# Patient Record
Sex: Male | Born: 2000 | Race: Black or African American | Hispanic: No | Marital: Single | Smoking: Current every day smoker
Health system: Southern US, Community
[De-identification: ages and names within clinical notes are randomized; demographics above are authoritative.]

## PROBLEM LIST (undated history)

## (undated) ENCOUNTER — Emergency Department (HOSPITAL_COMMUNITY): Payer: Medicaid Other

## (undated) DIAGNOSIS — F329 Major depressive disorder, single episode, unspecified: Secondary | ICD-10-CM

## (undated) DIAGNOSIS — J45909 Unspecified asthma, uncomplicated: Secondary | ICD-10-CM

## (undated) DIAGNOSIS — F32A Depression, unspecified: Secondary | ICD-10-CM

## (undated) DIAGNOSIS — L309 Dermatitis, unspecified: Secondary | ICD-10-CM

## (undated) DIAGNOSIS — F419 Anxiety disorder, unspecified: Secondary | ICD-10-CM

## (undated) DIAGNOSIS — F29 Unspecified psychosis not due to a substance or known physiological condition: Secondary | ICD-10-CM

## (undated) DIAGNOSIS — F1994 Other psychoactive substance use, unspecified with psychoactive substance-induced mood disorder: Secondary | ICD-10-CM

---
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
## 2018-03-26 IMAGING — CT CT HEAD W/O CM
3 series · 15 of 47 positions shown, 18 images · non-contrast
Comparison: June 06, 2015

CLINICAL DATA: Progressive headache and confusion

EXAM:
CT HEAD WITHOUT CONTRAST
TECHNIQUE: Contiguous axial images were obtained from the base of the skull
through the vertex without intravenous contrast.

[Series 3: head 5.0 h30s · axial · 0.49mm/px · z∈[-94,+36]mm · 9 of 32 slices shown, 12 images]
[im 3/32  brain]
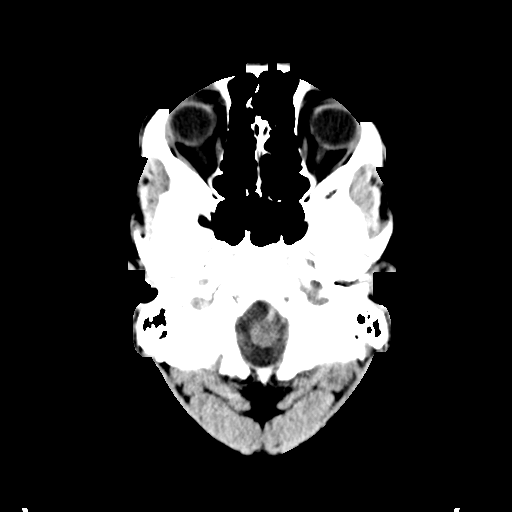
[im 3/32  bone]
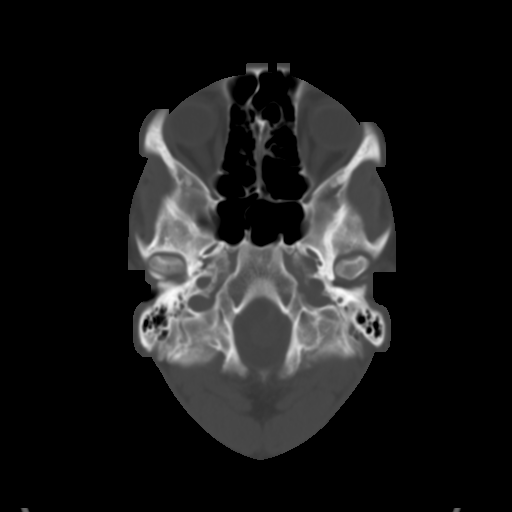
[im 6/32  brain]
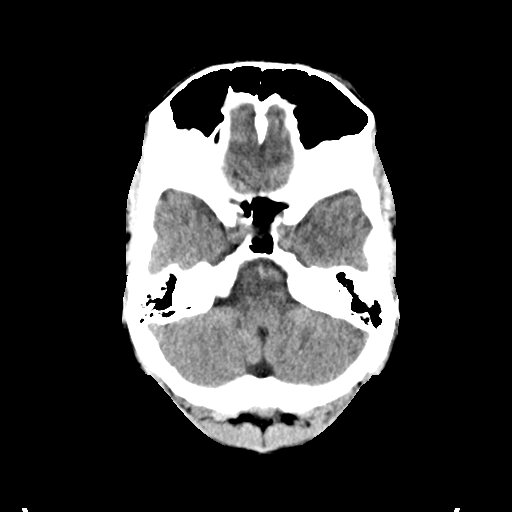
[im 9/32  brain]
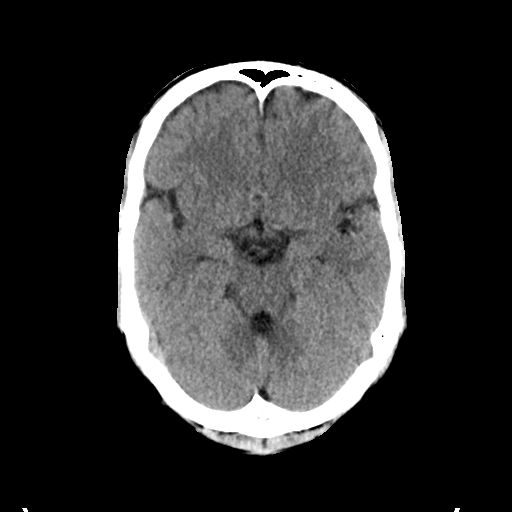
[im 12/32  brain]
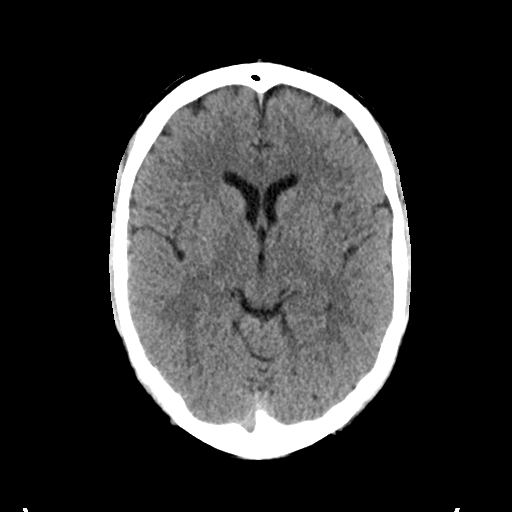
[im 17/32  brain]
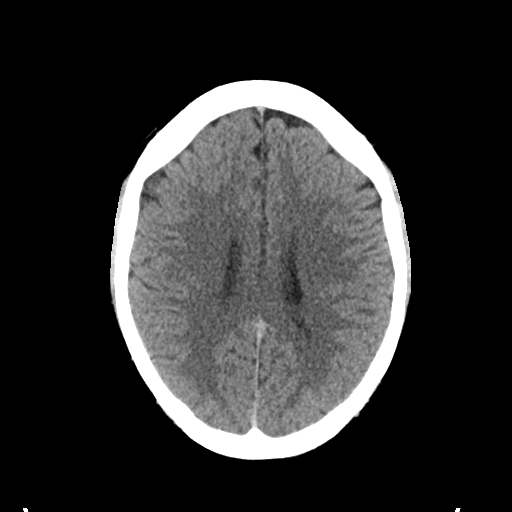
[im 17/32  bone]
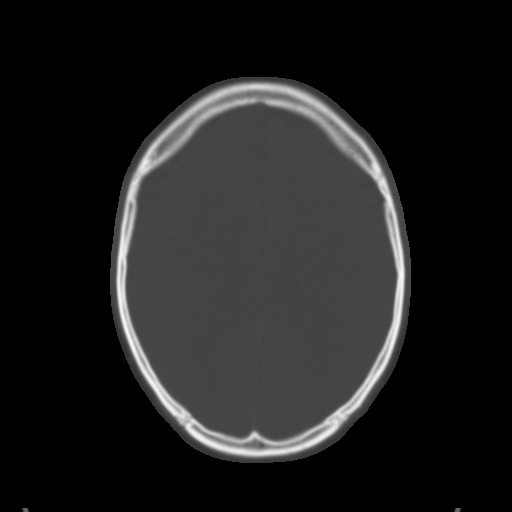
[im 20/32  brain]
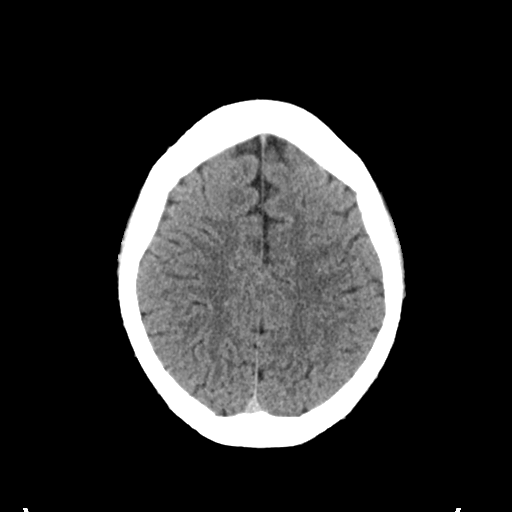
[im 23/32  brain]
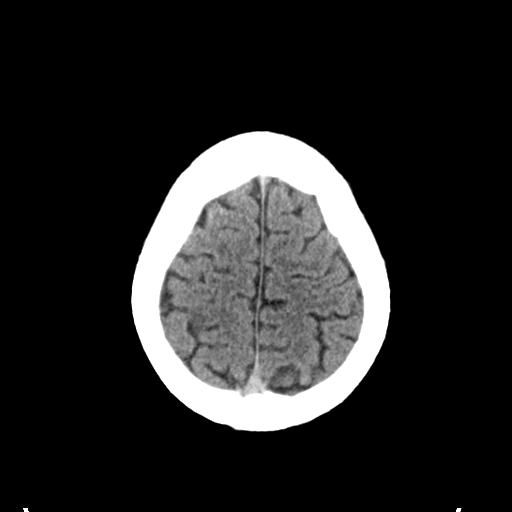
[im 26/32  brain]
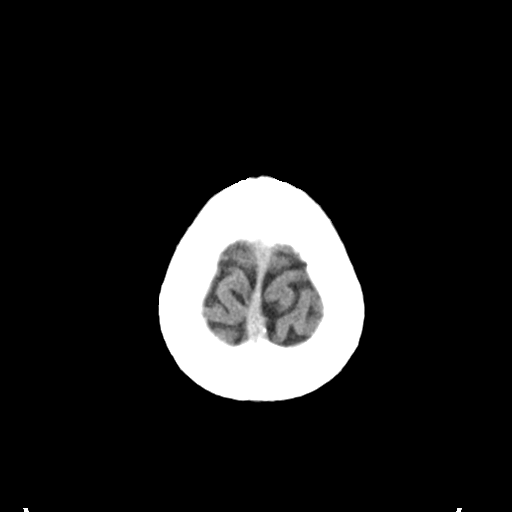
[im 29/32  brain]
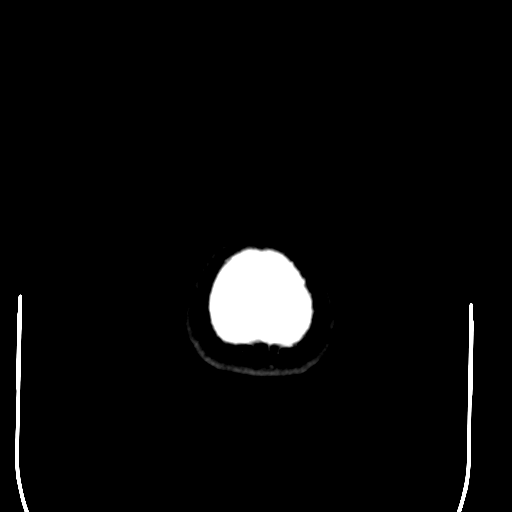
[im 29/32  bone]
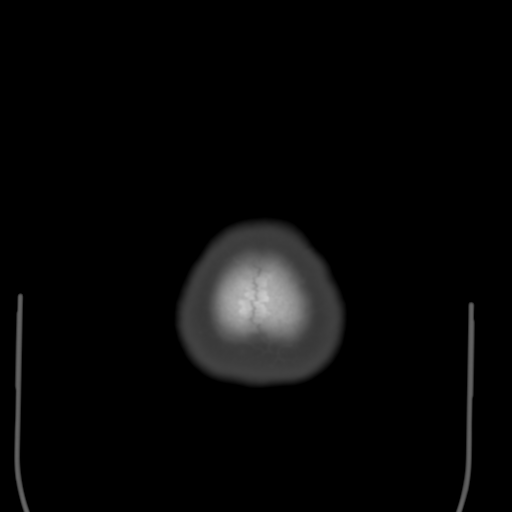

[Series 5: head 3.0 mpr cor · coronal · 0.33mm/px · 3 of 67 slices shown]
[im 23/67  brain]
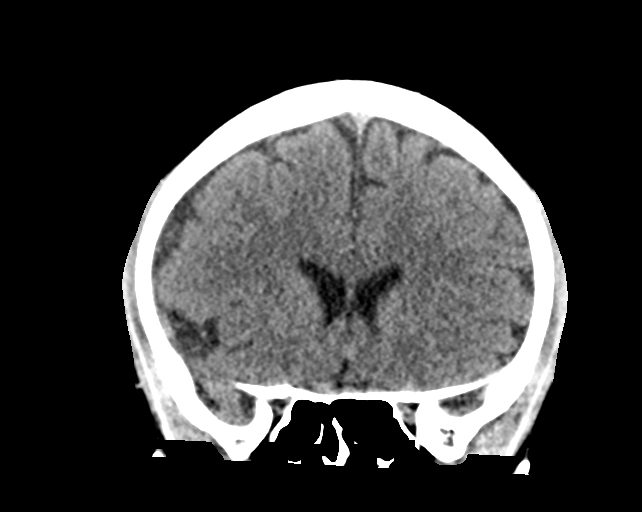
[im 30/67  brain]
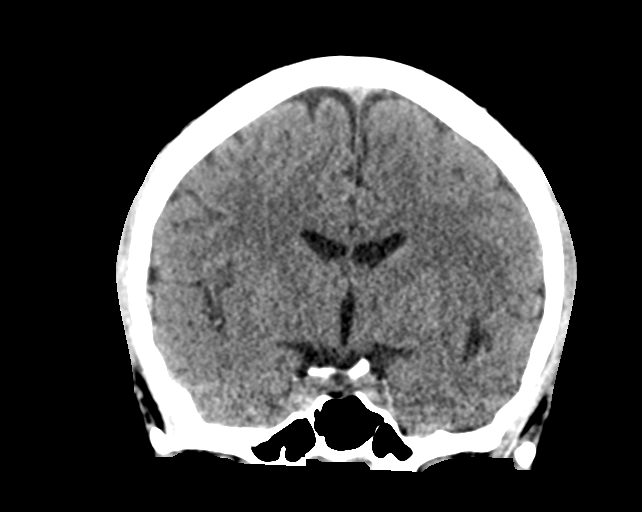
[im 37/67  brain]
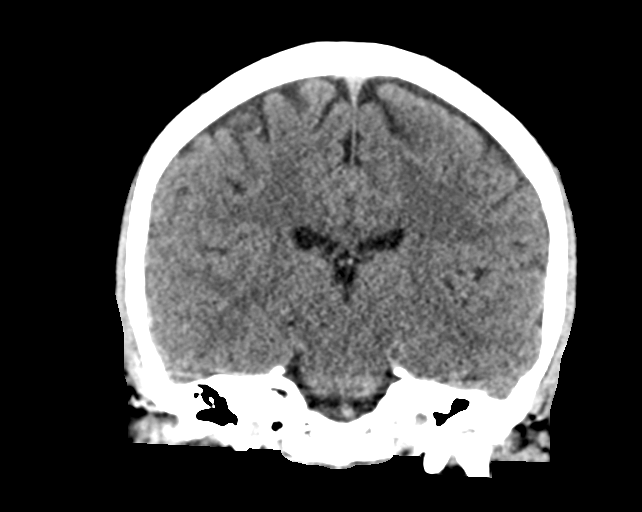

[Series 6: head 3.0 mpr sag · sagittal · 0.34mm/px · 3 of 61 slices shown]
[im 21/61  brain]
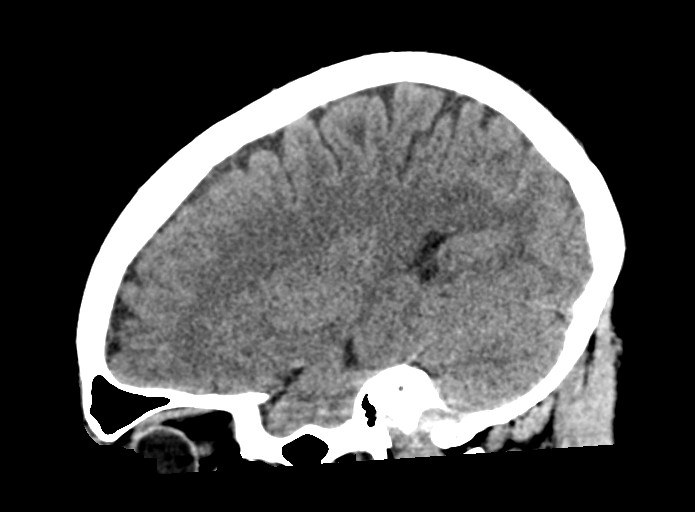
[im 31/61  brain]
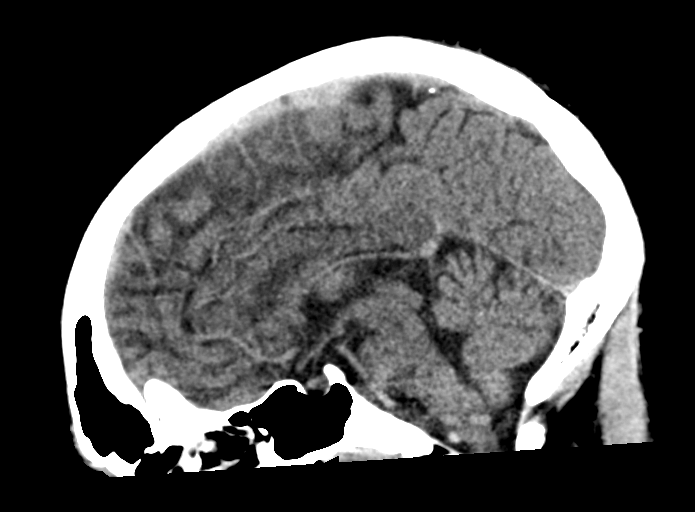
[im 41/61  brain]
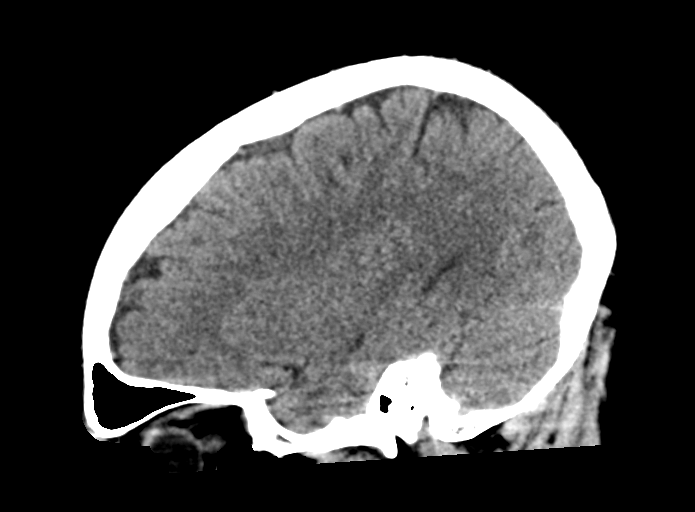

[15 of 47 positions shown; findings below may reference images not displayed]

FINDINGS: Brain: The ventricles are normal in size and configuration. There is
no intracranial mass, hemorrhage, extra-axial fluid collection, or
midline shift. Gray-white compartments appear normal. No evident
acute infarct.

Vascular: No hyperdense vessel. No vascular calcifications are
evident.

Skull: Bony calvarium appears intact.

There is mucosal thickening and mild opacification in several
ethmoid air cell regions bilaterally. Other visualized paranasal
sinuses are clear. Visualized orbits appear symmetric bilaterally.
There are several ethmoid air cells demonstrated

Other: Mastoid air cells clear.
IMPRESSION: Areas of ethmoid sinus disease bilaterally. No intracranial mass,
hemorrhage, or extra-axial fluid collection. Gray-white compartments
are normal.

## 2019-11-19 ENCOUNTER — Other Ambulatory Visit: Payer: Self-pay

## 2019-11-19 ENCOUNTER — Emergency Department
Admission: EM | Admit: 2019-11-19 | Discharge: 2019-11-19 | Disposition: A | Discharge status: Home / Self Care | Payer: Medicaid Other | Attending: Emergency Medicine | Admitting: Emergency Medicine

## 2019-11-19 ENCOUNTER — Emergency Department: Payer: Medicaid Other

## 2019-11-19 DIAGNOSIS — Z7951 Long term (current) use of inhaled steroids: Secondary | ICD-10-CM | POA: Insufficient documentation

## 2019-11-19 DIAGNOSIS — R0602 Shortness of breath: Secondary | ICD-10-CM | POA: Diagnosis present

## 2019-11-19 DIAGNOSIS — F419 Anxiety disorder, unspecified: Secondary | ICD-10-CM | POA: Insufficient documentation

## 2019-11-19 DIAGNOSIS — F29 Unspecified psychosis not due to a substance or known physiological condition: Secondary | ICD-10-CM | POA: Diagnosis present

## 2019-11-19 DIAGNOSIS — J45909 Unspecified asthma, uncomplicated: Secondary | ICD-10-CM | POA: Insufficient documentation

## 2019-11-19 DIAGNOSIS — F1994 Other psychoactive substance use, unspecified with psychoactive substance-induced mood disorder: Secondary | ICD-10-CM

## 2019-11-19 DIAGNOSIS — F329 Major depressive disorder, single episode, unspecified: Secondary | ICD-10-CM | POA: Diagnosis present

## 2019-11-19 DIAGNOSIS — F3132 Bipolar disorder, current episode depressed, moderate: Secondary | ICD-10-CM | POA: Diagnosis present

## 2019-11-19 DIAGNOSIS — F323 Major depressive disorder, single episode, severe with psychotic features: Secondary | ICD-10-CM | POA: Diagnosis present

## 2019-11-19 MED ORDER — IPRATROPIUM-ALBUTEROL 0.5-2.5 (3) MG/3ML IN SOLN
3.0000 mL | Freq: Once | RESPIRATORY_TRACT | Status: AC
  Administered 2019-11-19: 3 mL via RESPIRATORY_TRACT
  Filled 2019-11-19: qty 3

## 2019-11-19 MED ORDER — ALBUTEROL SULFATE HFA 108 (90 BASE) MCG/ACT IN AERS: 2.0000 | INHALATION_SPRAY | RESPIRATORY_TRACT | 0 refills | Status: DC | PRN

## 2019-11-19 NOTE — ED Provider Notes (Signed)
[facility] Emergency Department Provider Note   ____________________________________________   First MD Initiated Contact with Patient 11/19/19 0111     (approximate)  I have reviewed the triage vital signs and the nursing notes.   HISTORY  Chief Complaint Shortness of Breath    HPI Mario Lloyd is a 19 y.o. male who presents to the ED from home with a chief complaint of shortness of breath.  Patient with a history of asthma and states he has been short of breath for the past 3 years.  States symptoms no worse today. Denies fever, chills, cough, chest pain, abdominal pain, nausea, vomiting or diarrhea.  Denies recent travel, trauma or hormone use.  Denies COVID-19 exposure.       Past Medical History:  Diagnosis Date  . Anxiety   . Asthma   . Depression   . Eczema   . Eczema   . Psychosis 06/07/2015  . Substance induced mood disorder (HCC) 06/14/2015    Patient Active Problem List   Diagnosis Date Noted  . MDD (major depressive disorder) 08/14/2016  . Bipolar disorder with moderate depression (HCC) 08/14/2016  . Seizure-like activity (HCC) 08/10/2016  . Substance induced mood disorder (HCC) 06/14/2015  . Severe major depression with psychotic features (HCC)   . Psychosis (HCC) 06/07/2015    No past surgical history on file.  Prior to Admission medications   Medication Sig Start Date End Date Taking? Authorizing Provider  albuterol (VENTOLIN HFA) 108 (90 Base) MCG/ACT inhaler Inhale 2 puffs into the lungs every 4 (four) hours as needed for wheezing or shortness of breath. 11/19/19   Irean Hong, MD  citalopram (CELEXA) 10 MG tablet Take 10 mg by mouth daily.    [provider]  citalopram (CELEXA) 10 MG tablet Take 1 tablet (10 mg total) by mouth daily. 08/24/16   Thedora Hinders, MD  OLANZapine (ZYPREXA) 10 MG tablet Take 1 tablet (10 mg total) by mouth at bedtime. 08/23/16   Thedora Hinders, MD     Allergies Patient has no known allergies.  No family history on file.  Social History Social History   Tobacco Use  . Smoking status: Never Smoker  . Smokeless tobacco: Never Used  Substance Use Topics  . Alcohol use: No  . Drug use: Yes    Types: Marijuana    Comment: UDS was negative; Pt + Benzos    Review of Systems  Constitutional: No fever/chills Eyes: No visual changes. ENT: No sore throat. Cardiovascular: Denies chest pain. Respiratory: Positive for shortness of breath. Gastrointestinal: No abdominal pain.  No nausea, no vomiting.  No diarrhea.  No constipation. Genitourinary: Negative for dysuria. Musculoskeletal: Negative for back pain. Skin: Negative for rash. Neurological: Negative for headaches, focal weakness or numbness.   ____________________________________________   PHYSICAL EXAM:  VITAL SIGNS: ED Triage Vitals [11/19/19 0037]  Enc Vitals Group     BP (!) 135/95     Pulse Rate 70     Resp 20     Temp 98.7 F (37.1 C)     Temp Source Oral     SpO2 99 %     Weight 150 lb (68 kg)     Height 5\' 6"  (1.676 m)     Head Circumference      Peak Flow      Pain Score      Pain Loc      Pain Edu?      Excl. in GC?  Constitutional: Alert and oriented. Well appearing and in no acute distress. Eyes: Conjunctivae are normal. PERRL. EOMI. Head: Atraumatic. Nose: No congestion/rhinnorhea. Mouth/Throat: Mucous membranes are moist.  Oropharynx non-erythematous. Neck: No stridor.   Cardiovascular: Normal rate, regular rhythm. Grossly normal heart sounds.  Good peripheral circulation. Respiratory: Normal respiratory effort.  No retractions. Lungs slightly diminished bibasilarly. Gastrointestinal: Soft and nontender. No distention. No abdominal bruits. No CVA tenderness. Musculoskeletal: No lower extremity tenderness nor edema.  No joint effusions. Neurologic:  Normal speech and language. No gross focal neurologic deficits are appreciated. No gait  instability. Skin:  Skin is warm, dry and intact. No rash noted. Psychiatric: Mood and affect are normal. Speech and behavior are normal.  ____________________________________________   LABS (all labs ordered are listed, but only abnormal results are displayed)  Labs Reviewed  SARS CORONAVIRUS 2 (TAT 6-24 HRS)   ____________________________________________  EKG  None ____________________________________________  RADIOLOGY  ED MD interpretation: No acute cardiopulmonary process  Official radiology report(s): DG Chest 2 View  Result Date: 11/19/2019 CLINICAL DATA:  Shortness of breath, history of childhood asthma, current smoker EXAM: CHEST - 2 VIEW COMPARISON:  Radiograph 04/18/2004 FINDINGS: No consolidation, features of edema, pneumothorax, or effusion. Pulmonary vascularity is normally distributed. The cardiomediastinal contours are unremarkable. No acute osseous or soft tissue abnormality. IMPRESSION: No acute cardiopulmonary abnormality. Electronically Signed   By: Kreg Shropshire M.D.   On: 11/19/2019 01:37    ____________________________________________   PROCEDURES  Procedure(s) performed (including Critical Care):  Procedures   ____________________________________________   INITIAL IMPRESSION / ASSESSMENT AND PLAN / ED COURSE  As part of my medical decision making, I reviewed the following data within the electronic MEDICAL RECORD NUMBER Nursing notes reviewed and incorporated, Old chart reviewed, Radiograph reviewed and Notes from prior ED visits     Mario Lloyd was evaluated in Emergency Department on 11/19/2019 for the symptoms described in the history of present illness. He was evaluated in the context of the global COVID-19 pandemic, which necessitated consideration that the patient might be at risk for infection with the SARS-CoV-2 virus that causes COVID-19. Institutional protocols and algorithms that pertain to the evaluation of patients at risk for COVID-19  are in a state of rapid change based on information released by regulatory bodies including the CDC and federal and state organizations. These policies and algorithms were followed during the patient's care in the ED.    19 year old male presenting with shortness of breath x3 years; history of asthma. Differential includes, but is not limited to, viral syndrome, bronchitis including COPD exacerbation, pneumonia, reactive airway disease including asthma, CHF including exacerbation with or without pulmonary/interstitial edema, pneumothorax, ACS, thoracic trauma, and pulmonary embolism.  Will administer DuoNeb, obtain chest x-ray and reassess.  Clinical Course as of Nov 19 415  Fri Nov 19, 2019  0148 Updated patient on chest x-ray.  He is currently finishing his DuoNeb.  Has mentioned several times in some multiple staff members that he is anxious and has "a lot on his plate".  Has an extensive psychiatric history.  Denies active SI/HI/AH/VH, but states he would like to speak with behavioral health.   [JS]  0221 Patient was seen by psychiatric NP and TTS.  Patient has established outpatient follow-up, denied SI/HI to them.  Agree he may be discharged home with follow-up.  Feeling significantly better after breathing treatment.  Will prescribe albuterol inhaler and patient will follow up with his PCP.  Strict return precautions given.  Patient verbalizes understanding agrees  with plan of care.   [JS]  [id number] Patient requesting COVID-19 swab; he is getting vaccination next week.   [JS]    Clinical Course User Index [JS] Irean Hong, MD     ____________________________________________   FINAL CLINICAL IMPRESSION(S) / ED DIAGNOSES  Final diagnoses:  Shortness of breath  Anxiety     ED Discharge Orders         Ordered    albuterol (VENTOLIN HFA) 108 (90 Base) MCG/ACT inhaler  Every 4 hours PRN     Discontinue  Reprint     11/19/19 0247           Note:  This document was prepared  using Dragon voice recognition software and may include unintentional dictation errors.   Irean Hong, MD 11/19/19 [phone]

## 2019-11-19 NOTE — ED Notes (Signed)
Pt reports intermittent SOB for years. Reports childhood asthma. Reports during flare ups, feels mild tightness in chest and short of breath. Pt reports current smoker, reports sometimes SOB is triggered by anxiety. Does not notice any triggers by exercise, weather, foods, or otherwise.

## 2019-11-19 NOTE — ED Notes (Signed)
Pt reports he moved recently and has "had a lot going on right now." Pt reports he has been feeling more anxious than normal since the move. When asked directly, pt stated that he "wouldn't mind talking to someone while he's here"  MD notified, consults ordered

## 2019-11-19 NOTE — Discharge Instructions (Signed)
1.  Use Albuterol inhaler 2 puffs every 4 hours as needed for difficulty breathing. 2.  Return to the ER for worsening symptoms, persistent vomiting, difficulty breathing or other concerns.

## 2019-11-19 NOTE — ED Notes (Signed)
Pt left without signing or covid swab

## 2019-11-19 NOTE — ED Notes (Signed)
Pt denies command hallucinations. Pt denies SI/HI. Pt reports he takes his psychiatric medications inconsistently, advised to follow his prescriptions and follow up with his psychiatrist as soon as possible

## 2019-11-19 NOTE — Consult Note (Signed)
[facility] Face-to-Face Psychiatry Consult   Reason for Consult: SOB Referring Physician: Dr. Dolores Frame Patient Identification: Mario Lloyd MRN:  [id number] Principal Diagnosis: <principal problem not specified> Diagnosis:  Active Problems:   Psychosis (HCC)   Severe major depression with psychotic features (HCC)   Substance induced mood disorder (HCC)   MDD (major depressive disorder)   Bipolar disorder with moderate depression (HCC)   Total Time spent with patient: 30 minutes  Subjective: "No, I do not want hurt myself or anybody.  I do bother from anxiety and depression." Mario KLIEBERT is a 19 y.o. male patient presented to [facility] ED for POV voluntarily. Per the ED triage nurse note, the patient came in with complaints of shortness of breath for years.  He voiced symptoms not indifferent today than previous. The patient admits to being seen on an outpatient basis at neuropsychiatry in [place].  He is currently prescribed citalopram for anxiety and depression.  He revealed the last hospitalization were during his late teen years which he was hospitalized at behavioral health in [place] about three years ago.  The patient voiced he is doing okay.  He stated, "my anxiety and depression started when I was in high school and not doing well in school.  I got kicked off the basketball team, which made my depression and anxiety worse.  I had to be hospitalized.  I was placed on citalopram which has helped me a lot." The patient was seen face-to-face by this provider; the chart was reviewed and consulted with Dr.Sung on 11/19/2019 due to the patient's care. It was discussed with the EDP that the patient does not meet the criteria to be admitted to the psychiatric inpatient unit.  The patient is alert and oriented x 4, calm, cooperative, and mood-congruent with affect on evaluation. The patient does not appear to be responding to internal or external stimuli. Neither is the patient presenting with any  delusional thinking. The patient denies auditory or visual hallucinations. The patient denies any suicidal, homicidal, or self-harm ideations. The patient is not presenting with any psychotic or paranoid behaviors. During an encounter with the patient, he was able to answer questions appropriately.    Plan: The patient is not a safety risk to self or others and does not require psychiatric inpatient admission for stabilization and treatment.  HPI:   Past Psychiatric History:   Risk to Self:   NO Risk to Others:   No Prior Inpatient Therapy:   Yes Prior Outpatient Therapy:   Yes  Past Medical History:  Past Medical History:  Diagnosis Date  . Anxiety   . Asthma   . Depression   . Eczema   . Eczema   . Psychosis 06/07/2015  . Substance induced mood disorder (HCC) 06/14/2015   No past surgical history on file. Family History: No family history on file. Family Psychiatric  History: No pertinent family psychiatric history Social History:  Social History   Substance and Sexual Activity  Alcohol Use No     Social History   Substance and Sexual Activity  Drug Use Yes  . Types: Marijuana   Comment: UDS was negative; Pt + Benzos    Social History   Socioeconomic History  . Marital status: Single    Spouse name: Not on file  . Number of children: Not on file  . Years of education: Not on file  . Highest education level: Not on file  Occupational History  . Not on file  Tobacco Use  .  Smoking status: Never Smoker  . Smokeless tobacco: Never Used  Substance and Sexual Activity  . Alcohol use: No  . Drug use: Yes    Types: Marijuana    Comment: UDS was negative; Pt + Benzos  . Sexual activity: Never  Other Topics Concern  . Not on file  Social History Narrative  . Not on file   Social Determinants of Health   Financial Resource Strain:   . Difficulty of Paying Living Expenses:   Food Insecurity:   . Worried About Programme researcher, broadcasting/film/video in the Last Year:   . Engineer, site in the Last Year:   Transportation Needs:   . Freight forwarder (Medical):   Marland Kitchen Lack of Transportation (Non-Medical):   Physical Activity:   . Days of Exercise per Week:   . Minutes of Exercise per Session:   Stress:   . Feeling of Stress :   Social Connections:   . Frequency of Communication with Friends and Family:   . Frequency of Social Gatherings with Friends and Family:   . Attends Religious Services:   . Active Member of Clubs or Organizations:   . Attends Banker Meetings:   Marland Kitchen Marital Status:    Additional Social History:    Allergies:  No Known Allergies  Labs: No results found for this or any previous visit (from the past 48 hour(s)).  No current facility-administered medications for this encounter.   Current Outpatient Medications  Medication Sig Dispense Refill  . citalopram (CELEXA) 10 MG tablet Take 10 mg by mouth daily.    . citalopram (CELEXA) 10 MG tablet Take 1 tablet (10 mg total) by mouth daily. 30 tablet 0  . OLANZapine (ZYPREXA) 10 MG tablet Take 1 tablet (10 mg total) by mouth at bedtime. 30 tablet 0    Musculoskeletal: Strength & Muscle Tone: within normal limits Gait & Station: normal Patient leans: N/A  Psychiatric Specialty Exam: Physical Exam Psychiatric:        Attention and Perception: Attention normal.        Mood and Affect: Mood normal.        Speech: Speech normal.        Behavior: Behavior normal.        Thought Content: Thought content normal.        Cognition and Memory: Cognition normal.        Judgment: Judgment normal.     Review of Systems  Psychiatric/Behavioral: The patient is nervous/anxious.     Blood pressure (!) 135/95, pulse 70, temperature 98.7 F (37.1 C), temperature source Oral, resp. rate 20, height 5\' 6"  (1.676 m), weight 68 kg, SpO2 99 %.Body mass index is 24.21 kg/m.  General Appearance: Casual  Eye Contact:  Good  Speech:  Clear and Coherent  Volume:  Normal  Mood:  Anxious and  Depressed  Affect:  Appropriate and Congruent  Thought Process:  Coherent  Orientation:  Full (Time, Place, and Person)  Thought Content:  WDL and Logical  Suicidal Thoughts:  No  Homicidal Thoughts:  No  Memory:  Immediate;   Good Recent;   Good Remote;   Good  Judgement:  Good  Insight:  Good  Psychomotor Activity:  Normal  Concentration:  Concentration: Good and Attention Span: Good  Recall:  Good  Fund of Knowledge:  Good  Language:  Good  Akathisia:  Negative  Handed:  Right  AIMS (if indicated):     Assets:  Communication Skills  Desire for Improvement Physical Health Resilience Social Support  ADL's:  Intact  Cognition:  WNL  Sleep:        Treatment Plan Summary: Daily contact with patient to assess and evaluate symptoms and progress in treatment, Plan Patient does not meet criteria for psychiatric inpatient admission. and .  Disposition: No evidence of imminent risk to self or others at present.   Patient does not meet criteria for psychiatric inpatient admission. Supportive therapy provided about ongoing stressors. Refer to IOP. Discussed crisis plan, support from social network, calling 911, coming to the Emergency Department, and calling Suicide Hotline.  Gillermo Murdoch, NP 11/19/2019 2:42 AM

## 2019-11-19 NOTE — ED Triage Notes (Signed)
Pt in with co shob for years, states symptoms not any different today. NO shob noted at this time.

## 2020-06-10 ENCOUNTER — Emergency Department (HOSPITAL_COMMUNITY)
Admission: EM | Admit: 2020-06-10 | Discharge: 2020-06-11 | Disposition: A | Discharge status: Home / Self Care | Payer: Medicaid Other | Attending: Emergency Medicine | Admitting: Emergency Medicine

## 2020-06-10 ENCOUNTER — Encounter (HOSPITAL_COMMUNITY): Payer: Self-pay | Admitting: Emergency Medicine

## 2020-06-10 ENCOUNTER — Other Ambulatory Visit: Payer: Self-pay

## 2020-06-10 DIAGNOSIS — F3481 Disruptive mood dysregulation disorder: Secondary | ICD-10-CM | POA: Insufficient documentation

## 2020-06-10 DIAGNOSIS — J45909 Unspecified asthma, uncomplicated: Secondary | ICD-10-CM | POA: Diagnosis not present

## 2020-06-10 DIAGNOSIS — F69 Unspecified disorder of adult personality and behavior: Secondary | ICD-10-CM | POA: Diagnosis not present

## 2020-06-10 DIAGNOSIS — Z046 Encounter for general psychiatric examination, requested by authority: Secondary | ICD-10-CM | POA: Diagnosis present

## 2020-06-10 LAB — COMPREHENSIVE METABOLIC PANEL
ALT: 20 U/L (ref 0–44)
AST: 20 U/L (ref 15–41)
Albumin: 4.3 g/dL (ref 3.5–5.0)
Alkaline Phosphatase: 56 U/L (ref 38–126)
Anion gap: 8 (ref 5–15)
BUN: 15 mg/dL (ref 6–20)
CO2: 27 mmol/L (ref 22–32)
Calcium: 9.6 mg/dL (ref 8.9–10.3)
Chloride: 104 mmol/L (ref 98–111)
Creatinine, Ser: 1.26 mg/dL — ABNORMAL HIGH (ref 0.61–1.24)
GFR, Estimated: >60 mL/min (ref >60)
Glucose, Bld: 92 mg/dL (ref 70–99)
Potassium: 3.8 mmol/L (ref 3.5–5.1)
Sodium: 139 mmol/L (ref 135–145)
Total Bilirubin: 0.5 mg/dL (ref 0.3–1.2)
Total Protein: 7.2 g/dL (ref 6.5–8.1)

## 2020-06-10 LAB — CBC
HCT: 46.8 % (ref 39.0–52.0)
Hemoglobin: 14.8 g/dL (ref 13.0–17.0)
MCH: 27.9 pg (ref 26.0–34.0)
MCHC: 31.6 g/dL (ref 30.0–36.0)
MCV: 88.1 fL (ref 80.0–100.0)
Platelets: 305 10*3/uL (ref 150–400)
RBC: 5.31 MIL/uL (ref 4.22–5.81)
RDW: 13 % (ref 11.5–15.5)
WBC: 9.9 10*3/uL (ref 4.0–10.5)
nRBC: 0 % (ref 0.0–0.2)

## 2020-06-10 LAB — RAPID URINE DRUG SCREEN, HOSP PERFORMED
Amphetamines: NOT DETECTED
Barbiturates: NOT DETECTED
Benzodiazepines: NOT DETECTED
Cocaine: NOT DETECTED
Opiates: NOT DETECTED
Tetrahydrocannabinol: POSITIVE — AB

## 2020-06-10 LAB — ETHANOL: Alcohol, Ethyl (B): <10 mg/dL (ref <10)

## 2020-06-10 NOTE — ED Triage Notes (Signed)
Patient's father reports behavior changes on patient this week , blank stares , insomnia , arms twitchings , anxiety and restlessness , he suspects side effects of his medications , denies hallucinations or suicidal ideation .

## 2020-06-11 NOTE — BH Assessment (Signed)
Comprehensive Clinical Assessment (CCA) Note  06/11/2020 Mario Lloyd [id number]  Chief Complaint:  F34.8 Disruptive mood dysregulation disorder Chief Complaint  Patient presents with  . Psychiatric Evaluation   Visit Diagnosis:  F34.8 Disruptive mood dysregulation disorder  Mario Lloyd is a 20 years old male patient who [facility] accompanied by his father, Mario Lloyd [phone], who participated in assessment at  [name] request.  Pt reports that he worry's and suffer from depression. Pt reports "I become anxious and irritable when I am out of money, I want to make enough of money so I can move from my father's house".  Pt father's reports his son's behavorial has changed, "he is talking to himself; also looks off in to space".  Pt father reports that the changes occurred after his last injection". Pt reports no suicide ideation or suicide attempts.  Pt denies any recent manic symptoms.  Pt denies any history of intentional self-injurious behaviors.  Pt denies homicidal ideation or history of violence.  Pt reports "I use to hear voices when I was a junior in high school, the voices have stop, after I started my medication".  Pt says he smoke marijuana every other day and denies any other other substance use.   Pt identified his primary stressor as worrying about finding a new location to live and spending excess money on eating out.  Pt reports that he work part time for [employer].  Pt live with his father, step mother and brother.  Pt denies any history of mental illness and substance use in his family.  Pt denies any history of abuse or trauma.  Pt denies any current legal problems.    Pt say he is currently receiving monthly injection from [facility], " my medication might need to be adjusted, I can tell".  Pt is dressed in casual attire, alert, oriented x 4 with normal speech and restless motor behavioral.  Eye contact is good and Pt mood  euthymic.  [name] affect is anxious.  Thought process coherent and relevant.  [name] insight is good and judgement is impaired.  There is no indication Pt is currently responding to interneural stimuli or experiencing delusional thought content.  Pt was cooperative throughout assessment.            Disposition: [place] Abtsody Taylor PA-C, recommends pt to be discharged and follow-up with a forthcoming appointment at [facility] or Behavioral Health Outpatient. Disposition discussed with Magazine features editorChristine RN, via secure chat in Epic. RN to discuss disposition with EDP.  CCA Screening, Triage and Referral (STR)  Patient Reported Information How did you hear about us? Family/Friend  Referral name: No data recorded Referral phone number: [phone]   Whom do you see for routine medical problems? No data recorded Practice/Facility Name: No data recorded Practice/Facility Phone Number: No data recorded Name of Contact: No data recorded Contact Number: No data recorded Contact Fax Number: No data recorded Prescriber Name: No data recorded Prescriber Address (if known): No data recorded  What Is the Reason for Your Visit/Call Today? medication adjustment  How Long Has This Been Causing You Problems? 1 wk - 1 month  What Do You Feel Would Help You the Most Today? -- (Pt reports that his father feels that his medication needs to be adjusted.)   Have You Recently Been in Any Inpatient Treatment (Hospital/Detox/Crisis Center/28-Day Program)? No  Name/Location of Program/Hospital:No data recorded How Long Were You There? No data recorded When Were You  Discharged? No data recorded  Have You Ever Received Services From [facility] Before? Yes  Who Do You See at [facility]? 11/19/2019   Have You Recently Had Any Thoughts About Hurting Yourself? No  Are You Planning to Commit Suicide/Harm Yourself At This time? No   Have you Recently Had Thoughts About Hurting Someone [name]?  No  Explanation: No data recorded  Have You Used Any Alcohol or Drugs in the Past 24 Hours? No  How Long Ago Did You Use Drugs or Alcohol? No data recorded What Did You Use and How Much? No data recorded  Do You Currently Have a Therapist/Psychiatrist? Yes  Name of Therapist/Psychiatrist: Pt reports that he see Mario Lloyd at [facility]   Have You Been Recently Discharged From Any Office Practice or Programs? No  Explanation of Discharge From Practice/Program: No data recorded    CCA Screening Triage Referral Assessment Type of Contact: Tele-Assessment  Is this Initial or Reassessment? Initial Assessment  Date Telepsych consult ordered in [facility]:  06/11/2020  Time Telepsych consult ordered in [facility]:  No data recorded  Patient Reported Information Reviewed? Yes  Patient Left Without Being Seen? No data recorded Reason for Not Completing Assessment: No data recorded  Collateral Involvement: Pt provided his father information - [place] Hel[place] [phone]   Does Patient Have a Court Appointed Legal Guardian? No data recorded Name and Contact of Legal Guardian: No data recorded If Minor and Not Living with Parent(s), Who has Custody? n/a  Is CPS involved or ever been involved? Never  Is APS involved or ever been involved? -- (UTA)   Patient Determined To Be At Risk for Harm To Self or Others Based on Review of Patient Reported Information or Presenting Complaint? No  Method: No data recorded Availability of Means: No data recorded Intent: No data recorded Notification Required: No data recorded Additional Information for Danger to Others Potential: No data recorded Additional Comments for Danger to Others Potential: No data recorded Are There Guns or Other Weapons in Your Home? No data recorded Types of Guns/Weapons: No data recorded Are These Weapons Safely Secured?                            No data recorded Who Could Verify You Are Able To Have  These Secured: No data recorded Do You Have any Outstanding Charges, Pending Court Dates, Parole/Probation? No data recorded Contacted To Inform of Risk of Harm To Self or Others: No data recorded  Location of Assessment: [facility]   Does Patient Present under Involuntary Commitment? No  IVC Papers Initial File Date: No data recorded  [place] of Residence: [place]   Patient Currently Receiving the Following Services: Individual Therapy   Determination of Need: Urgent (48 hours)   Options For Referral: Outpatient Therapy     CCA Biopsychosocial Intake/Chief Complaint:  Anxiety, medication adjustment  Current Symptoms/Problems: worrying, restlessness   Patient Reported Schizophrenia/Schizoaffective Diagnosis in Past: No   Strengths: UTA  Preferences: UTA  Abilities: UTA   Type of Services Patient Feels are Needed: Pt reports that he see Education officer, community at [facility] and he open for therapy   Initial Clinical Notes/Concerns: worrying   Mental Health Symptoms Depression:  No data recorded  Duration of Depressive symptoms: No data recorded  Mania:  No data recorded  Anxiety:   Restlessness; Irritability; Worrying   Psychosis:  None   Duration of Psychotic symptoms: No  data recorded  Trauma:  None   Obsessions:  None   Compulsions:  None   Inattention:  Disorganized   Hyperactivity/Impulsivity:  N/A   Oppositional/Defiant Behaviors:  None   Emotional Irregularity:  Unstable self-image   Other Mood/Personality Symptoms:  No data recorded   Mental Status Exam Appearance and self-care  Stature:  Average   Weight:  Average weight   Clothing:  Casual   Grooming:  Normal   Cosmetic use:  Age appropriate   Posture/gait:  Normal   Motor activity:  Restless   Sensorium  Attention:  Normal   Concentration:  Normal   Orientation:  Object; Person; Place; Situation   Recall/memory:  Normal   Affect and Mood   Affect:  Anxious   Mood:  Euthymic   Relating  Eye contact:  Normal   Facial expression:  Responsive   Attitude toward examiner:  Cooperative   Thought and Language  Speech flow: Clear and Coherent   Thought content:  Appropriate to Mood and Circumstances   Preoccupation:  -- (UTA)   Hallucinations:  Other (Comment) (Pt father states he talks to himself)   Organization:  No data recorded  [employer] of Knowledge:  Average   Intelligence:  -- Industrial/product designer)   Abstraction:  -- (UTA)   Judgement:  No data recorded  Reality Testing:  Realistic   Insight:  Good   Decision Making:  Normal   Social Functioning  Social Maturity:  Impulsive   Social Judgement:  -- Industrial/product designer)   Stress  Stressors:  Family conflict (Pt reports that he wants to make enough of money so he can leave his father's house.)   Coping Ability:  Normal   Skill Deficits:  Museum/gallery curator; Decision making   Supports:  Friends/Service system     Religion: Religion/Spirituality Are You A Religious Person?:  (UTA) How Might This Affect Treatment?: UTA  Leisure/Recreation: Leisure / Recreation Do You Have Hobbies?:  (UTA)  Exercise/Diet: Exercise/Diet Do You Exercise?:  (UTA) Have You Gained or Lost A Significant Amount of Weight in the Past Six Months?: No Do You Follow a Special Diet?:  (UTA) Do You Have Any Trouble Sleeping?: No (Pt reports he sleeps eight hours during the night)   CCA Employment/Education Employment/Work Situation: Employment / Work Situation Employment situation: Employed Where is patient currently employed?: [employer] How long has patient been employed?: [employer] Patient's job has been impacted by current illness: No What is the longest time patient has a held a job?: UTA Where was the patient employed at that time?: UTA Has patient ever been in the [employer]?: No  Education: Education Is Patient Currently Attending School?: No Last Grade  Completed: 12 Name of High School: [employer] Did [place] Graduate From [employer]?: Yes Did Theme park manager?: No Did Designer, television/film set?: No Did You Have Any Special Interests In School?: UTA Did You Have An Individualized Education Program (IIEP):  (UTA) Did You Have Any Difficulty At School?:  (UTA) Patient's Education Has Been Impacted by Current Illness:  (UTA)   CCA Family/Childhood History Family and Relationship History: Family history Are you sexually active?:  (UTA) What is your sexual orientation?: UTA Has your sexual activity been affected by drugs, alcohol, medication, or emotional stress?: UTA Does patient have children?: No  Childhood History:  Childhood History By whom was/is the patient raised?: Father (Pt lives with his father, stepmom and brother) Additional childhood history information: UTA Description of patient's relationship  with caregiver when they were a child: UTA Patient's description of current relationship with people who raised him/her: UTA How were you disciplined when you got in trouble as a child/adolescent?: UTA Does patient have siblings?: Yes Number of Siblings: 1 Description of patient's current relationship with siblings: UTA Did patient suffer any verbal/emotional/physical/sexual abuse as a child?: No Did patient suffer from severe childhood neglect?: No Has patient ever been sexually abused/assaulted/raped as an adolescent or adult?: No Was the patient ever a victim of a crime or a disaster?: No Witnessed domestic violence?: No Has patient been affected by domestic violence as an adult?: No  Child/Adolescent Assessment:     CCA Substance Use Alcohol/Drug Use: Alcohol / Drug Use Pain Medications: See MRA Prescriptions: See MRA Over the Counter: See MRA History of alcohol / drug use?: Yes Negative Consequences of Use: Personal relationships Withdrawal Symptoms: Agitation Substance #1 Name of Substance 1:  Marijuana 1 - Age of First Use: UTA 1 - Amount (size/oz): 1 Blunt 1 - Frequency: Every other day 1 - Duration: ongoing 1 - Last Use / Amount: UTA                       ASAM's:  Six Dimensions of Multidimensional Assessment  Dimension 1:  Acute Intoxication and/or Withdrawal Potential:      Dimension 2:  Biomedical Conditions and Complications:      Dimension 3:  Emotional, Behavioral, or Cognitive Conditions and Complications:     Dimension 4:  Readiness to Change:     Dimension 5:  Relapse, Continued use, or Continued Problem Potential:     Dimension 6:  Recovery/Living Environment:     ASAM Severity Score:    ASAM Recommended Level of Treatment:     Substance use Disorder (SUD)    Recommendations for Services/Supports/Treatments:    DSM5 Diagnoses: Patient Active Problem List   Diagnosis Date Noted  . MDD (major depressive disorder) 08/14/2016  . Bipolar disorder with moderate depression (HCC) 08/14/2016  . Seizure-like activity (HCC) 08/10/2016  . Substance induced mood disorder (HCC) 06/14/2015  . Severe major depression with psychotic features (HCC)   . Psychosis (HCC) 06/07/2015       Referrals to Alternative Service(s): Referred to Alternative Service(s):   Place:   Date:   Time:    Referred to Alternative Service(s):   Place:   Date:   Time:    Referred to Alternative Service(s):   Place:   Date:   Time:    Referred to Alternative Service(s):   Place:   Date:   Time:     Meryle Ready, Counselor

## 2020-06-11 NOTE — ED Notes (Signed)
Breakfast order placed ?

## 2020-06-11 NOTE — ED Provider Notes (Signed)
[facility] EMERGENCY DEPARTMENT Provider Note   CSN: [id number] Arrival date & time: 06/10/20  1934     History Chief Complaint  Patient presents with  . Psychiatric Evaluation    Mario Lloyd is a 20 y.o. male.  The history is provided by the patient and medical records.    20 y.o. M here for psychiatric evaluation.  Father expressed concerns about his behaviors to triage RN.  Patient denies this, states "my dad overreacts".  He admit he has not been sleeping but it is because "I am worried about making money".  He states he does eat well.  He states he does not understand why he has to be here because nothing is wrong.  States he is taking his medications as directed.  12:27 AM Called and spoke with patient's parents via phone-- states he has been getting monthly Abilify injections for the past 2 years.  He switched to injections because he was noncompliant with oral medications.  Father states over the past 2 weeks he has had spacing out episodes, not taking care of his hygiene, decreased sleep (4 hours a night or so).  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  They have noticed him talking to himself and he did admit that he was hearing voices a few days ago.  Did report to mother that he is only been taking his Celexa every other day.  Past Medical History:  Diagnosis Date  . Anxiety   . Asthma   . Depression   . Eczema   . Eczema   . Psychosis (HCC) 06/07/2015  . Substance induced mood disorder (HCC) 06/14/2015    Patient Active Problem List   Diagnosis Date Noted  . MDD (major depressive disorder) 08/14/2016  . Bipolar disorder with moderate depression (HCC) 08/14/2016  . Seizure-like activity (HCC) 08/10/2016  . Substance induced mood disorder (HCC) 06/14/2015  . Severe major depression with psychotic features (HCC)   . Psychosis (HCC) 06/07/2015    History reviewed. No pertinent surgical  history.     No family history on file.  Social History   Tobacco Use  . Smoking status: Never Smoker  . Smokeless tobacco: Never Used  Substance Use Topics  . Alcohol use: No  . Drug use: Yes    Types: Marijuana    Comment: UDS was negative; Pt + Benzos    Home Medications Prior to Admission medications   Medication Sig Start Date End Date Taking? Authorizing Provider  albuterol (VENTOLIN HFA) 108 (90 Base) MCG/ACT inhaler Inhale 2 puffs into the lungs every 4 (four) hours as needed for wheezing or shortness of breath. 11/19/19   Irean Hong, MD  citalopram (CELEXA) 10 MG tablet Take 10 mg by mouth daily.    [provider]  citalopram (CELEXA) 10 MG tablet Take 1 tablet (10 mg total) by mouth daily. 08/24/16   Thedora Hinders, MD  OLANZapine (ZYPREXA) 10 MG tablet Take 1 tablet (10 mg total) by mouth at bedtime. 08/23/16   Thedora Hinders, MD    Allergies    Patient has no known allergies.  Review of Systems   Review of Systems  Psychiatric/Behavioral: Positive for behavioral problems.  All other systems reviewed and are negative.   Physical Exam Updated Vital Signs BP 129/68   Pulse 68   Temp 98.3 F (36.8 C) (Oral)   Resp 17   Ht 5\' 7"  (1.702 m)  Wt 75 kg   SpO2 98%   BMI 25.90 kg/m   Physical Exam Vitals and nursing note reviewed.  Constitutional:      Appearance: He is well-developed and well-nourished.  HENT:     Head: Normocephalic and atraumatic.     Mouth/Throat:     Mouth: Oropharynx is clear and moist.  Eyes:     Extraocular Movements: EOM normal.     Conjunctiva/sclera: Conjunctivae normal.     Pupils: Pupils are equal, round, and reactive to light.  Cardiovascular:     Rate and Rhythm: Normal rate and regular rhythm.     Heart sounds: Normal heart sounds.  Pulmonary:     Effort: Pulmonary effort is normal.     Breath sounds: Normal breath sounds.  Abdominal:     General: Bowel sounds are normal.      Palpations: Abdomen is soft.  Musculoskeletal:        General: Normal range of motion.     Cervical back: Normal range of motion.  Skin:    General: Skin is warm and dry.  Neurological:     Mental Status: He is alert and oriented to person, place, and time.  Psychiatric:        Mood and Affect: Mood and affect normal.     Comments: Odd affect, spacing out a bit during exam (?internal stimuli), denies SI/HI/AVH to me     ED Results / Procedures / Treatments   Labs (all labs ordered are listed, but only abnormal results are displayed) Labs Reviewed  COMPREHENSIVE METABOLIC PANEL - Abnormal; Notable for the following components:      Result Value   Creatinine, Ser 1.26 (*)    All other components within normal limits  RAPID URINE DRUG SCREEN, HOSP PERFORMED - Abnormal; Notable for the following components:   Tetrahydrocannabinol POSITIVE (*)    All other components within normal limits  ETHANOL  CBC    EKG None  Radiology No results found.  Procedures Procedures (including critical care time)  Medications Ordered in ED Medications - No data to display  ED Course  I have reviewed the triage vital signs and the nursing notes.  Pertinent labs & imaging results that were available during my care of the patient were reviewed by me and considered in my medical decision making (see chart for details).    MDM Rules/Calculators/A&P  20 year old male presenting to the ED for change in behavior.  Patient is not very forthcoming with details, he is agreeable to let me speak with father about this.  Over the past 2 weeks he is been neglecting his personal hygiene, not sleeping, has been hearing voices, and talking to himself.  He did tell his mother that he had only been taking his Celexa every other day.  He does receive IM Abilify injections monthly.  Last dose was 2 weeks ago.  Labs are reassuring.  COVID screen pending.  Will get TTS consult.  Final Clinical Impression(s) / ED  Diagnoses Final diagnoses:  Adult behavior problem    Rx / DC Orders ED Discharge Orders    None       Garlon Hatchet, PA-C 06/11/20 4008    Marily Memos, MD 06/11/20 [phone]

## 2021-01-03 ENCOUNTER — Emergency Department (HOSPITAL_COMMUNITY)
Admission: EM | Admit: 2021-01-03 | Discharge: 2021-01-03 | Disposition: A | Discharge status: Home / Self Care | Payer: Medicaid Other | Attending: Emergency Medicine | Admitting: Emergency Medicine

## 2021-01-03 ENCOUNTER — Encounter (HOSPITAL_COMMUNITY): Payer: Self-pay | Admitting: Emergency Medicine

## 2021-01-03 ENCOUNTER — Emergency Department (EMERGENCY_DEPARTMENT_HOSPITAL)
Admission: EM | Admit: 2021-01-03 | Discharge: 2021-01-04 | Disposition: A | Discharge status: Home / Self Care | Payer: Medicaid Other | Source: Home / Self Care | Attending: Emergency Medicine | Admitting: Emergency Medicine

## 2021-01-03 DIAGNOSIS — F1290 Cannabis use, unspecified, uncomplicated: Secondary | ICD-10-CM | POA: Insufficient documentation

## 2021-01-03 DIAGNOSIS — S01511A Laceration without foreign body of lip, initial encounter: Secondary | ICD-10-CM | POA: Insufficient documentation

## 2021-01-03 DIAGNOSIS — F69 Unspecified disorder of adult personality and behavior: Secondary | ICD-10-CM | POA: Insufficient documentation

## 2021-01-03 DIAGNOSIS — Y900 Blood alcohol level of less than 20 mg/100 ml: Secondary | ICD-10-CM | POA: Insufficient documentation

## 2021-01-03 DIAGNOSIS — Z79899 Other long term (current) drug therapy: Secondary | ICD-10-CM | POA: Insufficient documentation

## 2021-01-03 DIAGNOSIS — F329 Major depressive disorder, single episode, unspecified: Secondary | ICD-10-CM | POA: Insufficient documentation

## 2021-01-03 DIAGNOSIS — S60222A Contusion of left hand, initial encounter: Secondary | ICD-10-CM | POA: Insufficient documentation

## 2021-01-03 DIAGNOSIS — T07XXXA Unspecified multiple injuries, initial encounter: Secondary | ICD-10-CM

## 2021-01-03 DIAGNOSIS — S60221A Contusion of right hand, initial encounter: Secondary | ICD-10-CM | POA: Insufficient documentation

## 2021-01-03 DIAGNOSIS — J45909 Unspecified asthma, uncomplicated: Secondary | ICD-10-CM | POA: Insufficient documentation

## 2021-01-03 DIAGNOSIS — Z20822 Contact with and (suspected) exposure to covid-19: Secondary | ICD-10-CM | POA: Insufficient documentation

## 2021-01-03 DIAGNOSIS — Z23 Encounter for immunization: Secondary | ICD-10-CM | POA: Diagnosis not present

## 2021-01-03 DIAGNOSIS — F3341 Major depressive disorder, recurrent, in partial remission: Secondary | ICD-10-CM | POA: Diagnosis not present

## 2021-01-03 MED ORDER — TETANUS-DIPHTH-ACELL PERTUSSIS 5-2.5-18.5 LF-MCG/0.5 IM SUSY
0.5000 mL | PREFILLED_SYRINGE | Freq: Once | INTRAMUSCULAR | Status: AC
Start: 2021-01-03 — End: 2021-01-03
  Administered 2021-01-03: 0.5 mL via INTRAMUSCULAR
  Filled 2021-01-03: qty 0.5

## 2021-01-03 MED ORDER — LIDOCAINE-EPINEPHRINE (PF) 2 %-1:200000 IJ SOLN
10.0000 mL | Freq: Once | INTRAMUSCULAR | Status: AC
  Administered 2021-01-03: 10 mL
  Filled 2021-01-03: qty 20

## 2021-01-03 NOTE — Discharge Instructions (Addendum)
Keep the wounds clean with soap and water 2 or 3 times a day.  Use ice on sore spots today and tomorrow after that use heat.  See a doctor to have the sutures removed in 5 days.  Take Tylenol or Motrin for pain.
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
## 2021-01-03 NOTE — ED Provider Notes (Signed)
Emergency Medicine Provider Triage Evaluation Note  KELLIS MCADAM , a 20 y.o. male  was evaluated in triage.  Pt complains of not feeling right in the head.  States that he feels like his has down syndrome and is having trouble at home.  Denies SI/HI.  Denies drug or alcohol use.   Review of Systems  Positive: Mental health problems Negative: Fever, chills  Physical Exam  BP 136/88 (BP Location: Right Arm)   Pulse 61   Temp 98 F (36.7 C)   Resp (!) 22   SpO2 100%  Gen:   Awake, no distress   Resp:  Normal effort  MSK:   Moves extremities without difficulty  Other:    Medical Decision Making  Medically screening exam initiated at 11:20 PM.  Appropriate orders placed.  YOHAN SAMONS was informed that the remainder of the evaluation will be completed by another provider, this initial triage assessment does not replace that evaluation, and the importance of remaining in the ED until their evaluation is complete.  Requesting to talk with a counselor about his mental health.   Roxy Horseman, PA-C 01/03/21 Janetta Hora    Linwood Dibbles, MD 01/05/21 [phone]
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
## 2021-01-04 DIAGNOSIS — F3341 Major depressive disorder, recurrent, in partial remission: Secondary | ICD-10-CM

## 2021-01-04 LAB — CBC WITH DIFFERENTIAL/PLATELET
Abs Immature Granulocytes: 0.04 10*3/uL (ref 0.00–0.07)
Basophils Absolute: 0 10*3/uL (ref 0.0–0.1)
Basophils Relative: 0 %
Eosinophils Absolute: 0.1 10*3/uL (ref 0.0–0.5)
Eosinophils Relative: 1 %
HCT: 45.8 % (ref 39.0–52.0)
Hemoglobin: 14.9 g/dL (ref 13.0–17.0)
Immature Granulocytes: 0 %
Lymphocytes Relative: 18 %
Lymphs Abs: 2.4 10*3/uL (ref 0.7–4.0)
MCH: 28.3 pg (ref 26.0–34.0)
MCHC: 32.5 g/dL (ref 30.0–36.0)
MCV: 86.9 fL (ref 80.0–100.0)
Monocytes Absolute: 0.9 10*3/uL (ref 0.1–1.0)
Monocytes Relative: 7 %
Neutro Abs: 9.5 10*3/uL — ABNORMAL HIGH (ref 1.7–7.7)
Neutrophils Relative %: 74 %
Platelets: 316 10*3/uL (ref 150–400)
RBC: 5.27 MIL/uL (ref 4.22–5.81)
RDW: 12.9 % (ref 11.5–15.5)
WBC: 12.9 10*3/uL — ABNORMAL HIGH (ref 4.0–10.5)
nRBC: 0 % (ref 0.0–0.2)

## 2021-01-04 LAB — COMPREHENSIVE METABOLIC PANEL
ALT: 18 U/L (ref 0–44)
AST: 24 U/L (ref 15–41)
Albumin: 4.6 g/dL (ref 3.5–5.0)
Alkaline Phosphatase: 61 U/L (ref 38–126)
Anion gap: 9 (ref 5–15)
BUN: 13 mg/dL (ref 6–20)
CO2: 24 mmol/L (ref 22–32)
Calcium: 9.5 mg/dL (ref 8.9–10.3)
Chloride: 104 mmol/L (ref 98–111)
Creatinine, Ser: 1.08 mg/dL (ref 0.61–1.24)
GFR, Estimated: >60 mL/min (ref >60)
Glucose, Bld: 84 mg/dL (ref 70–99)
Potassium: 3.9 mmol/L (ref 3.5–5.1)
Sodium: 137 mmol/L (ref 135–145)
Total Bilirubin: 1.2 mg/dL (ref 0.3–1.2)
Total Protein: 7.6 g/dL (ref 6.5–8.1)

## 2021-01-04 LAB — RAPID URINE DRUG SCREEN, HOSP PERFORMED
Amphetamines: NOT DETECTED
Barbiturates: NOT DETECTED
Benzodiazepines: NOT DETECTED
Cocaine: NOT DETECTED
Opiates: NOT DETECTED
Tetrahydrocannabinol: POSITIVE — AB

## 2021-01-04 LAB — ETHANOL: Alcohol, Ethyl (B): <10 mg/dL (ref <10)

## 2021-01-04 LAB — SALICYLATE LEVEL: Salicylate Lvl: <7 mg/dL — ABNORMAL LOW (ref 7.0–30.0)

## 2021-01-04 LAB — RESP PANEL BY RT-PCR (FLU A&B, COVID) ARPGX2
Influenza A by PCR: NEGATIVE
Influenza B by PCR: NEGATIVE
SARS Coronavirus 2 by RT PCR: NEGATIVE

## 2021-01-04 LAB — ACETAMINOPHEN LEVEL: Acetaminophen (Tylenol), Serum: <10 ug/mL — ABNORMAL LOW (ref 10–30)

## 2021-01-04 MED ORDER — CITALOPRAM HYDROBROMIDE 10 MG PO TABS: 10.0000 mg | ORAL_TABLET | Freq: Every day | ORAL | Status: DC

## 2021-01-04 MED ORDER — IBUPROFEN 400 MG PO TABS: 600.0000 mg | ORAL_TABLET | Freq: Three times a day (TID) | ORAL | Status: DC | PRN

## 2021-01-04 NOTE — ED Provider Notes (Signed)
Emergency Medicine Observation Re-evaluation Note  Mario Lloyd is a 20 y.o. male, seen on rounds today.  Pt initially presented to the ED for complaints of Psychiatric Evaluation Currently, the patient is awaiting psychiatric reevaluation.  Physical Exam  BP 136/88 (BP Location: Right Arm)   Pulse 61   Temp 98 F (36.7 C)   Resp (!) 22   SpO2 100%  Physical Exam General: Initially sleeping.  Subsequently awake and alert, calm. Cardiac: Normal heart rate, extremities well perfused. Lungs: Breathing is even and unlabored Psych: No distress, does not appear to be responding to internal stimuli. HENT: Swelling noted to upper lip.  ED Course / MDM  EKG:   I have reviewed the labs performed to date as well as medications administered while in observation.  Recent changes in the last 24 hours include patient underwent a psychiatric reevaluation.  Psychiatry cleared the patient for discharge.  Instructions provided in AVS.  Patient was discharged in stable condition.  Plan  Patient was discharged.  KRAMER HANRAHAN is not under involuntary commitment.     Gloris Manchester, MD 01/04/21 [phone]

## 2021-01-04 NOTE — [facility] Assessment (Signed)
[facility] Assessment Progress Note  Per Liborio Nixon, NP, this pt does not require psychiatric hospitalization at this time.  Pt is psychiatrically cleared.  Pt is reportedly seen for outpatient psychiatry by [name], NP, who has recently changed practices. Following Internet search, this Clinical research associate found that she is now with [facility].  At 11:32 I called them and spoke to [facility], who scheduled pt for an appointment on Monday, 01/08/2021 at 09:45.  This has been entered into pt's discharge instructions.    Patrice and pt's nurse, Malachi Bonds, have been notified.  Doylene Canning, MA Triage Specialist [phone]
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
## 2021-01-04 NOTE — Discharge Instructions (Signed)
For your behavioral health needs you are advised to continue treatment with [name], DNP.  Please note that she has moved to a different location as listed below.  You have an appointment with her scheduled for Monday, January 08, 2021 at 9:45 am:       [name], DNP      [facility]      [address]      [place], [place] [address]      [phone]

## 2021-01-04 NOTE — ED Notes (Signed)
Pt wonded in triage, food given.
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
## 2021-01-04 NOTE — ED Notes (Signed)
Pt moved to private room for TTS consult.

## 2021-01-04 NOTE — ED Notes (Signed)
Pt resting in hallway stretcher with eyes closed, breathing regular and unlabored, no distress noted.

## 2021-01-04 NOTE — BH Assessment (Signed)
Clinician attempted to make contact with pt's providers to complete pt's MH Assessment but pt has not been assigned to any nurses/EDPs because he is still in the waiting room.

## 2021-01-04 NOTE — BH Assessment (Signed)
Clinician messaged Su Hoff, RN "Hey this Gananda with TTS. Is the pt able to be assessed? If so can he be placed in a private room and is the pt under IVC."   Clinician awaiting response.    Redmond Pulling, MS, [facility], [facility] Triage Specialist [phone]
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
## 2021-01-04 NOTE — ED Notes (Signed)
Pt verbalized understanding of d/c instructions, meds and followup care, including upcoming psychiatry appointment. Denies questions. VSS, no distress noted.  Pt given all belongings including shoes, clothing ,and valuables envelope from security with phone chargers and a bottle of pills he states is his antidepressant.Steady gait to exit with father's spouse to take him home.

## 2021-01-08 ENCOUNTER — Ambulatory Visit (HOSPITAL_COMMUNITY)
Admission: EM | Admit: 2021-01-08 | Discharge: 2021-01-08 | Disposition: A | Discharge status: Home / Self Care | Payer: Medicaid Other | Attending: Psychiatry | Admitting: Psychiatry

## 2021-01-08 ENCOUNTER — Other Ambulatory Visit: Payer: Self-pay

## 2021-01-08 DIAGNOSIS — F209 Schizophrenia, unspecified: Secondary | ICD-10-CM | POA: Insufficient documentation

## 2021-01-08 DIAGNOSIS — Z638 Other specified problems related to primary support group: Secondary | ICD-10-CM | POA: Insufficient documentation

## 2021-01-08 DIAGNOSIS — R4689 Other symptoms and signs involving appearance and behavior: Secondary | ICD-10-CM

## 2021-01-08 NOTE — ED Notes (Signed)
A&O x4.. no s/s pain, discomfort, or acute distress noted. No SI, HI, or AVH noted. Escorted to front lobby and d/c. Medically stable at time of d/c
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
## 2021-01-08 NOTE — Discharge Instructions (Addendum)

## 2021-01-10 ENCOUNTER — Emergency Department (HOSPITAL_COMMUNITY)
Admission: EM | Admit: 2021-01-10 | Discharge: 2021-01-11 | Disposition: A | Discharge status: Other Acute Inpatient Hospital | Payer: Medicaid Other | Source: Home / Self Care | Attending: Emergency Medicine | Admitting: Emergency Medicine

## 2021-01-10 ENCOUNTER — Other Ambulatory Visit: Payer: Self-pay

## 2021-01-10 ENCOUNTER — Ambulatory Visit (HOSPITAL_COMMUNITY)
Admission: EM | Admit: 2021-01-10 | Discharge: 2021-01-10 | Disposition: A | Discharge status: Other Acute Inpatient Hospital | Payer: Medicaid Other | Attending: Registered Nurse | Admitting: Registered Nurse

## 2021-01-10 DIAGNOSIS — F17200 Nicotine dependence, unspecified, uncomplicated: Secondary | ICD-10-CM | POA: Insufficient documentation

## 2021-01-10 DIAGNOSIS — F200 Paranoid schizophrenia: Secondary | ICD-10-CM | POA: Diagnosis not present

## 2021-01-10 DIAGNOSIS — R443 Hallucinations, unspecified: Secondary | ICD-10-CM

## 2021-01-10 DIAGNOSIS — R441 Visual hallucinations: Secondary | ICD-10-CM | POA: Insufficient documentation

## 2021-01-10 DIAGNOSIS — F1290 Cannabis use, unspecified, uncomplicated: Secondary | ICD-10-CM | POA: Insufficient documentation

## 2021-01-10 DIAGNOSIS — Z20822 Contact with and (suspected) exposure to covid-19: Secondary | ICD-10-CM | POA: Insufficient documentation

## 2021-01-10 DIAGNOSIS — J45909 Unspecified asthma, uncomplicated: Secondary | ICD-10-CM | POA: Insufficient documentation

## 2021-01-10 DIAGNOSIS — Z79899 Other long term (current) drug therapy: Secondary | ICD-10-CM | POA: Insufficient documentation

## 2021-01-10 DIAGNOSIS — Y900 Blood alcohol level of less than 20 mg/100 ml: Secondary | ICD-10-CM | POA: Insufficient documentation

## 2021-01-10 DIAGNOSIS — Z008 Encounter for other general examination: Secondary | ICD-10-CM

## 2021-01-10 LAB — CBC WITH DIFFERENTIAL/PLATELET
Abs Immature Granulocytes: 0.04 10*3/uL (ref 0.00–0.07)
Abs Immature Granulocytes: 0.03 10*3/uL (ref 0.00–0.07)
Basophils Absolute: 0 10*3/uL (ref 0.0–0.1)
Basophils Absolute: 0 10*3/uL (ref 0.0–0.1)
Basophils Relative: 0 %
Basophils Relative: 0 %
Eosinophils Absolute: 0 10*3/uL (ref 0.0–0.5)
Eosinophils Absolute: 0 10*3/uL (ref 0.0–0.5)
Eosinophils Relative: 0 %
Eosinophils Relative: 0 %
HCT: 46.8 % (ref 39.0–52.0)
HCT: 48.6 % (ref 39.0–52.0)
Hemoglobin: 15.2 g/dL (ref 13.0–17.0)
Hemoglobin: 15.5 g/dL (ref 13.0–17.0)
Immature Granulocytes: 0 %
Immature Granulocytes: 0 %
Lymphocytes Relative: 19 %
Lymphocytes Relative: 23 %
Lymphs Abs: 1.7 10*3/uL (ref 0.7–4.0)
Lymphs Abs: 2.2 10*3/uL (ref 0.7–4.0)
MCH: 28.3 pg (ref 26.0–34.0)
MCH: 28.2 pg (ref 26.0–34.0)
MCHC: 32.5 g/dL (ref 30.0–36.0)
MCHC: 31.9 g/dL (ref 30.0–36.0)
MCV: 87.2 fL (ref 80.0–100.0)
MCV: 88.5 fL (ref 80.0–100.0)
Monocytes Absolute: 0.5 10*3/uL (ref 0.1–1.0)
Monocytes Absolute: 0.5 10*3/uL (ref 0.1–1.0)
Monocytes Relative: 5 %
Monocytes Relative: 5 %
Neutro Abs: 6.6 10*3/uL (ref 1.7–7.7)
Neutro Abs: 6.8 10*3/uL (ref 1.7–7.7)
Neutrophils Relative %: 76 %
Neutrophils Relative %: 72 %
Platelets: 348 10*3/uL (ref 150–400)
Platelets: 332 10*3/uL (ref 150–400)
RBC: 5.37 MIL/uL (ref 4.22–5.81)
RBC: 5.49 MIL/uL (ref 4.22–5.81)
RDW: 12.9 % (ref 11.5–15.5)
RDW: 12.9 % (ref 11.5–15.5)
WBC: 8.9 10*3/uL (ref 4.0–10.5)
WBC: 9.5 10*3/uL (ref 4.0–10.5)
nRBC: 0 % (ref 0.0–0.2)
nRBC: 0 % (ref 0.0–0.2)

## 2021-01-10 LAB — TSH: TSH: 0.604 u[IU]/mL (ref 0.350–4.500)

## 2021-01-10 LAB — ETHANOL
Alcohol, Ethyl (B): <10 mg/dL (ref <10)
Alcohol, Ethyl (B): <10 mg/dL (ref <10)

## 2021-01-10 LAB — COMPREHENSIVE METABOLIC PANEL
ALT: 15 U/L (ref 0–44)
ALT: 17 U/L (ref 0–44)
AST: 21 U/L (ref 15–41)
AST: 21 U/L (ref 15–41)
Albumin: 4.8 g/dL (ref 3.5–5.0)
Albumin: 5.4 g/dL — ABNORMAL HIGH (ref 3.5–5.0)
Alkaline Phosphatase: 62 U/L (ref 38–126)
Alkaline Phosphatase: 70 U/L (ref 38–126)
Anion gap: 9 (ref 5–15)
Anion gap: 8 (ref 5–15)
BUN: 11 mg/dL (ref 6–20)
BUN: 11 mg/dL (ref 6–20)
CO2: 26 mmol/L (ref 22–32)
CO2: 30 mmol/L (ref 22–32)
Calcium: 9.9 mg/dL (ref 8.9–10.3)
Calcium: 10.1 mg/dL (ref 8.9–10.3)
Chloride: 103 mmol/L (ref 98–111)
Chloride: 100 mmol/L (ref 98–111)
Creatinine, Ser: 1.06 mg/dL (ref 0.61–1.24)
Creatinine, Ser: 0.96 mg/dL (ref 0.61–1.24)
GFR, Estimated: >60 mL/min (ref >60)
GFR, Estimated: >60 mL/min (ref >60)
Glucose, Bld: 73 mg/dL (ref 70–99)
Glucose, Bld: 121 mg/dL — ABNORMAL HIGH (ref 70–99)
Potassium: 4.2 mmol/L (ref 3.5–5.1)
Potassium: 3.7 mmol/L (ref 3.5–5.1)
Sodium: 138 mmol/L (ref 135–145)
Sodium: 138 mmol/L (ref 135–145)
Total Bilirubin: 1 mg/dL (ref 0.3–1.2)
Total Bilirubin: 1.1 mg/dL (ref 0.3–1.2)
Total Protein: 7.4 g/dL (ref 6.5–8.1)
Total Protein: 8.6 g/dL — ABNORMAL HIGH (ref 6.5–8.1)

## 2021-01-10 LAB — RESP PANEL BY RT-PCR (FLU A&B, COVID) ARPGX2
Influenza A by PCR: NEGATIVE
Influenza A by PCR: NEGATIVE
Influenza B by PCR: NEGATIVE
Influenza B by PCR: NEGATIVE
SARS Coronavirus 2 by RT PCR: NEGATIVE
SARS Coronavirus 2 by RT PCR: NEGATIVE

## 2021-01-10 LAB — HEMOGLOBIN A1C
Hgb A1c MFr Bld: 5.4 % (ref 4.8–5.6)
Mean Plasma Glucose: 108.28 mg/dL

## 2021-01-10 LAB — RAPID URINE DRUG SCREEN, HOSP PERFORMED
Amphetamines: NOT DETECTED
Barbiturates: NOT DETECTED
Benzodiazepines: NOT DETECTED
Cocaine: NOT DETECTED
Opiates: NOT DETECTED
Tetrahydrocannabinol: POSITIVE — AB

## 2021-01-10 LAB — MAGNESIUM: Magnesium: 1.9 mg/dL (ref 1.7–2.4)

## 2021-01-10 MED ORDER — ACETAMINOPHEN 325 MG PO TABS: 650.0000 mg | ORAL_TABLET | Freq: Four times a day (QID) | ORAL | Status: DC | PRN

## 2021-01-10 MED ORDER — DIVALPROEX SODIUM 250 MG PO DR TAB: 250.0000 mg | DELAYED_RELEASE_TABLET | Freq: Two times a day (BID) | ORAL | Status: DC

## 2021-01-10 MED ORDER — HYDROXYZINE HCL 25 MG PO TABS: 25.0000 mg | ORAL_TABLET | Freq: Three times a day (TID) | ORAL | 0 refills | Status: DC | PRN

## 2021-01-10 MED ORDER — ARIPIPRAZOLE ER 400 MG IM SRER: 400.0000 mg | INTRAMUSCULAR | Status: DC

## 2021-01-10 MED ORDER — TRAZODONE HCL 50 MG PO TABS: 50.0000 mg | ORAL_TABLET | Freq: Every evening | ORAL | Status: DC | PRN

## 2021-01-10 MED ORDER — ALUM & MAG HYDROXIDE-SIMETH 200-200-20 MG/5ML PO SUSP: 30.0000 mL | ORAL | Status: DC | PRN

## 2021-01-10 MED ORDER — MAGNESIUM HYDROXIDE 400 MG/5ML PO SUSP: 30.0000 mL | Freq: Every day | ORAL | Status: DC | PRN

## 2021-01-10 MED ORDER — HYDROXYZINE HCL 25 MG PO TABS: 25.0000 mg | ORAL_TABLET | Freq: Three times a day (TID) | ORAL | Status: DC | PRN

## 2021-01-10 MED ORDER — ABILIFY MAINTENA 400 MG IM SRER: 400.0000 mg | INTRAMUSCULAR | Status: DC
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
## 2021-01-10 NOTE — ED Notes (Signed)
Pt wanded by security before being transferred to room 7.
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
## 2021-01-10 NOTE — ED Notes (Signed)
Discharge instructions provided and Pt stated understanding. Pt alert, orient and ambulatory prior to d/c from facility. Personal belongings returned from the orange locker. Safe transport called for transportation services to [facility]. Report previously called to [facility] RN at [facility]. Pt escorted to the sally port. Safety maintained.

## 2021-01-10 NOTE — ED Provider Notes (Signed)
[facility]-EMERGENCY DEPT Provider Note   CSN: [id number] Arrival date & time: 01/10/21  1836     History Chief Complaint  Patient presents with   Medical Clearance    Mario Lloyd is a 20 y.o. male.  HPI  Patient presents from behavioral health with visual and auditory hallucinations.  He has a medical history of anxiety and depression, was diagnosed earlier today by his outpatient psychiatrist with schizophrenia.  He has been getting into more verbal altercations with his parents and being more agitated than normal.  He also has not been sleeping well at all in the last 2 days.  Patient denies any missed doses of his medicine, he does not have pain anywhere else.  Patient states "he is fine, unsure why he is here".  Patient also has 3 sutures in his upper lip, these were put in 01/03/2021.  Past Medical History:  Diagnosis Date   Anxiety    Asthma    Depression    Eczema    Eczema    Psychosis (HCC) 06/07/2015   Substance induced mood disorder (HCC) 06/14/2015    Patient Active Problem List   Diagnosis Date Noted   Schizophrenia, paranoid (HCC) 01/10/2021   MDD (major depressive disorder) 08/14/2016   Bipolar disorder with moderate depression (HCC) 08/14/2016   Seizure-like activity (HCC) 08/10/2016   Substance induced mood disorder (HCC) 06/14/2015   Severe major depression with psychotic features (HCC)    Psychosis (HCC) 06/07/2015    No past surgical history on file.     No family history on file.  Social History   Tobacco Use   Smoking status: Never   Smokeless tobacco: Never  Substance Use Topics   Alcohol use: No   Drug use: Yes    Types: Marijuana    Comment: UDS was negative; Pt + Benzos    Home Medications Prior to Admission medications   Medication Sig Start Date End Date Taking? Authorizing Provider  ABILIFY MAINTENA 400 MG SRER injection Inject 2 mLs (400 mg total) into the muscle every 30 (thirty) days. Next dose due  01/17/21 01/10/21   Rankin, Shuvon B, NP  albuterol (VENTOLIN HFA) 108 (90 Base) MCG/ACT inhaler Inhale 2 puffs into the lungs every 4 (four) hours as needed for wheezing or shortness of breath. Patient not taking: No sig reported 11/19/19   Irean Hong, MD  benztropine (COGENTIN) 0.5 MG tablet Take 0.5 mg by mouth at bedtime. Patient not taking: No sig reported 07/26/20   [provider]  divalproex (DEPAKOTE) 250 MG DR tablet Take 1 tablet (250 mg total) by mouth every 12 (twelve) hours. 01/10/21   Rankin, Shuvon B, NP  hydrOXYzine (ATARAX/VISTARIL) 25 MG tablet Take 1 tablet (25 mg total) by mouth 3 (three) times daily as needed for anxiety. 01/10/21   Rankin, Shuvon B, NP  traZODone (DESYREL) 50 MG tablet Take 1 tablet (50 mg total) by mouth at bedtime and may repeat dose one time if needed. 01/10/21   Rankin, Shuvon B, NP    Allergies    Patient has no known allergies.  Review of Systems   Review of Systems  Constitutional:  Negative for fatigue and fever.  Skin:        3 sutures to his upper lip, well-appearing.  Psychiatric/Behavioral:  Positive for behavioral problems, hallucinations and sleep disturbance. Negative for self-injury and suicidal ideas. The patient is nervous/anxious.    Physical Exam Updated Vital Signs BP 122/81 (BP Location:  Left Arm)   Pulse 76   Temp 98.1 F (36.7 C) (Oral)   Resp 20   Ht 5\' 7"  (1.702 m)   Wt 74.8 kg   SpO2 99%   BMI 25.84 kg/m   Physical Exam Vitals and nursing note reviewed. Exam conducted with a chaperone present.  Constitutional:      General: He is not in acute distress.    Appearance: Normal appearance.  HENT:     Head: Normocephalic and atraumatic.  Eyes:     General: No scleral icterus.    Extraocular Movements: Extraocular movements intact.     Pupils: Pupils are equal, round, and reactive to light.  Skin:    Coloration: Skin is not jaundiced.     Comments: 3 sutures to the upper lip, well appearing.  Neurological:      Mental Status: He is alert. Mental status is at baseline.     Coordination: Coordination normal.  Psychiatric:        Attention and Perception: Attention normal. He perceives auditory and visual hallucinations.        Mood and Affect: Mood normal.        Speech: Speech normal.        Behavior: Behavior is cooperative.        Thought Content: Thought content normal.        Cognition and Memory: Cognition normal.        Judgment: Judgment normal.     Comments: Patient is not responding to internal stimuli at this time, he has had auditory and visual hallucinations prior to his arrival here.  He is answering questions appropriately, responding to questions appropriately.    ED Results / Procedures / Treatments   Labs (all labs ordered are listed, but only abnormal results are displayed) Labs Reviewed  RESP PANEL BY RT-PCR (FLU A&B, COVID) [id number]  COMPREHENSIVE METABOLIC PANEL  ETHANOL  RAPID URINE DRUG SCREEN, HOSP PERFORMED  CBC WITH DIFFERENTIAL/PLATELET    EKG None  Radiology No results found.  Procedures Procedures   Medications Ordered in ED Medications - No data to display  ED Course  I have reviewed the triage vital signs and the nursing notes.  Pertinent labs & imaging results that were available during my care of the patient were reviewed by me and considered in my medical decision making (see chart for details).  Clinical Course as of 01/10/21 2031  Wed Jan 10, 2021  2022 CBC with Diff No leukocytosis, no anemia  [HS]  2030 Ethanol Low [HS]  2030 Urine rapid drug screen (hosp performed)(!) Marijuana, no other drug use  [HS]  2030 Comprehensive metabolic panel(!) No electrolyte derangement, no AKI, no LFT elevation  [HS]    Clinical Course User Index [HS] Jan 12, 2021, PA-C   MDM Rules/Calculators/A&P                           Patient vitals are stable, physical exam is unremarkable.  He does not see a reason for him to be in the ED, reviewed notes  from urgent care and behavioral health and they believe he meets inpatient criteria.  Patient apparently has a new onset diagnosis schizophrenia as well as been having hallucinations (visual and auditory).  He has been agitated at home and getting into verbal altercations with his parents who are worried it is going to escalate.  We will medically clear and plan for inpatient admission.  However, given that patient  does not believe he needs to be here will IVC preemptively.  Hopefully the paperwork would not be required, but will have it in case he tries to leave against medical advisement.  3 Prolene sutures to the upper lip, removed without any complications.  Patient handled procedure well.  Pt appropriate for psych eval at this time. Medically cleared   Final Clinical Impression(s) / ED Diagnoses Final diagnoses:  None    Rx / DC Orders ED Discharge Orders     None        Lorita Officer 01/10/21 2032    Gerhard Munch, MD 01/10/21 [id number]

## 2021-01-10 NOTE — Progress Notes (Signed)
CSW informed that there are no appropriate beds at [facility] per [facility] pt was referred out by this CSW. CSW will continue to assist and follow for potential bed offers for inpatient behavioral health placement.    Maryjean Ka, MSW, [facility] 01/10/2021 9:41 PM

## 2021-01-10 NOTE — ED Notes (Signed)
Report previously called to [facility] RN per [facility] NP. Safe transport called for transportation services to [facility]. Blood specimen collected x1 stick via the right ac, tolerated well. Covid test x2 collected also. Distribution called to collect specimens to deliver to [facility] STAT. Safety maintained and will continue to monitor.

## 2021-01-10 NOTE — Progress Notes (Signed)
Through chart review; Per Assunta Found, NP recommend psychiatric Inpatient admission when medically cleared. CSW sent inpatient behavioral health referral to the following facilities:   Destination Service Provider Address Phone Fax  [facility]  [address], [place] [address] [phone]  [facility]  [address] [place], [place] [address] [phone]  [facility]  [address] [place] [address] [phone]  [facility]  [address] [place] [address] [phone]  [facility]  [address], [place] [address] [phone]  [facility]  [address], [place] [address] [phone]  [facility]  [address], [place] [address] [phone]  [facility]  [address] [place] [address] [phone]  [facility]  [address], [place] [address] [phone]  [facility]  [address] [place], [place] [address] [phone]  [facility]  [address] [place] [address] [phone]  Maryjean Ka, MSW, [facility] 01/10/2021 5:51 PM
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
## 2021-01-11 ENCOUNTER — Emergency Department (HOSPITAL_COMMUNITY): Payer: Medicaid Other

## 2021-01-11 ENCOUNTER — Inpatient Hospital Stay (HOSPITAL_COMMUNITY)
Admission: AD | Admit: 2021-01-11 | Discharge: 2021-01-17 | DRG: 885 | Disposition: A | Discharge status: Home / Self Care | Payer: Medicaid Other | Source: Intra-hospital | Attending: Psychiatry | Admitting: Psychiatry

## 2021-01-11 ENCOUNTER — Encounter (HOSPITAL_COMMUNITY): Payer: Self-pay | Admitting: Family Medicine

## 2021-01-11 DIAGNOSIS — F203 Undifferentiated schizophrenia: Principal | ICD-10-CM | POA: Diagnosis present

## 2021-01-11 DIAGNOSIS — Z79899 Other long term (current) drug therapy: Secondary | ICD-10-CM

## 2021-01-11 DIAGNOSIS — F1210 Cannabis abuse, uncomplicated: Secondary | ICD-10-CM | POA: Diagnosis present

## 2021-01-11 DIAGNOSIS — F419 Anxiety disorder, unspecified: Secondary | ICD-10-CM | POA: Diagnosis present

## 2021-01-11 DIAGNOSIS — F1220 Cannabis dependence, uncomplicated: Secondary | ICD-10-CM | POA: Diagnosis present

## 2021-01-11 DIAGNOSIS — G4710 Hypersomnia, unspecified: Secondary | ICD-10-CM | POA: Diagnosis present

## 2021-01-11 DIAGNOSIS — G4700 Insomnia, unspecified: Secondary | ICD-10-CM | POA: Diagnosis present

## 2021-01-11 DIAGNOSIS — Z20822 Contact with and (suspected) exposure to covid-19: Secondary | ICD-10-CM | POA: Diagnosis present

## 2021-01-11 DIAGNOSIS — F209 Schizophrenia, unspecified: Secondary | ICD-10-CM | POA: Diagnosis present

## 2021-01-11 LAB — VALPROIC ACID LEVEL: Valproic Acid Lvl: <10 ug/mL — ABNORMAL LOW (ref 50.0–100.0)

## 2021-01-11 LAB — PROLACTIN: Prolactin: 0.3 ng/mL — ABNORMAL LOW (ref 4.0–15.2)

## 2021-01-11 MED ORDER — DIVALPROEX SODIUM 250 MG PO DR TAB
250.0000 mg | DELAYED_RELEASE_TABLET | Freq: Two times a day (BID) | ORAL | Status: DC
  Administered 2021-01-11: 250 mg via ORAL
  Filled 2021-01-11: qty 1

## 2021-01-11 MED ORDER — ACETAMINOPHEN 325 MG PO TABS: 650.0000 mg | ORAL_TABLET | Freq: Four times a day (QID) | ORAL | Status: DC | PRN

## 2021-01-11 MED ORDER — MAGNESIUM HYDROXIDE 400 MG/5ML PO SUSP
30.0000 mL | Freq: Every day | ORAL | Status: DC | PRN
Start: 2021-01-11 — End: 2021-01-17

## 2021-01-11 MED ORDER — ALUM & MAG HYDROXIDE-SIMETH 200-200-20 MG/5ML PO SUSP: 30.0000 mL | ORAL | Status: DC | PRN

## 2021-01-11 MED ORDER — DIVALPROEX SODIUM 250 MG PO DR TAB
250.0000 mg | DELAYED_RELEASE_TABLET | Freq: Two times a day (BID) | ORAL | Status: DC
  Administered 2021-01-12 – 2021-01-17 (×11): 250 mg via ORAL
  Filled 2021-01-11 (×16): qty 1

## 2021-01-11 MED ORDER — HALOPERIDOL 1 MG PO TABS
2.0000 mg | ORAL_TABLET | Freq: Two times a day (BID) | ORAL | Status: DC
  Administered 2021-01-11: 2 mg via ORAL
  Filled 2021-01-11: qty 2

## 2021-01-11 MED ORDER — HYDROXYZINE HCL 25 MG PO TABS
25.0000 mg | ORAL_TABLET | Freq: Three times a day (TID) | ORAL | Status: DC | PRN
  Administered 2021-01-13 – 2021-01-14 (×2): 25 mg via ORAL
  Filled 2021-01-11 (×2): qty 1

## 2021-01-11 MED ORDER — ARIPIPRAZOLE ER 400 MG IM SRER: 400.0000 mg | Freq: Once | INTRAMUSCULAR | Status: DC

## 2021-01-11 MED ORDER — HALOPERIDOL 2 MG PO TABS
2.0000 mg | ORAL_TABLET | Freq: Two times a day (BID) | ORAL | Status: DC
  Administered 2021-01-12: 2 mg via ORAL
  Filled 2021-01-11 (×5): qty 1

## 2021-01-11 MED ORDER — TRAZODONE HCL 50 MG PO TABS
50.0000 mg | ORAL_TABLET | Freq: Every evening | ORAL | Status: DC | PRN
  Administered 2021-01-13 – 2021-01-16 (×3): 50 mg via ORAL
  Filled 2021-01-11 (×4): qty 1
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
## 2021-01-11 NOTE — BH Assessment (Signed)
[facility] Assessment Progress Note   Per Dorena Bodo, NP, this pt requires psychiatric hospitalization.  Linsey, RN, [facility] has pre-assigned pt to [facility] Rm 403-2 o the service of Nelly Rout, MD.  [facility] will be ready to receive pt at 21:30.  Pt presents under IVC initiated by EDP Gerhard Munch, MD and IVC documents have been faxed to [facility].  EDP Kristine Royal, MD and pt's nurse, Rosette Reveal, have been notified, and Galaxi agrees to call report to [phone].  Pt is to be transported via Patent [employer].   Doylene Canning, [place] Behavioral Health Coordinator [phone]

## 2021-01-11 NOTE — Consult Note (Addendum)
Chart review completed:  QT/QTc  377/421  Abilify Maintena 400 mg IM due 01/17/21- mood/antipsychotic Depakote 250 mg PO BID - mood  Haldol 2 mg PO BID- acute psychosis   Valproic acid level ordered while in ED.  CT Head: negative for acute findings.   Patient is being admitted to [facility] later today.

## 2021-01-11 NOTE — ED Notes (Signed)
Patient resting comfortably in hallway stretcher at this time. Patient cooperative and calm. Denies any complaints at this time.

## 2021-01-11 NOTE — BH Assessment (Signed)
@  1122, requested patient's nurse Ladona Ridgel, RN) and charge nurse Dierdre Searles) to move the cart to patients room for his re-assessment.

## 2021-01-11 NOTE — ED Notes (Signed)
Sheriff's Department contacted to facilitate pt transfer to [facility]
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
## 2021-01-11 NOTE — ED Provider Notes (Signed)
Emergency Medicine Observation Re-evaluation Note  Mario Lloyd is a 20 y.o. male, seen on rounds today.  Pt initially presented to the ED for complaints of Medical Clearance Currently, the patient is resting.  Physical Exam  BP 125/73 (BP Location: Left Arm)   Pulse (!) 47   Temp 98.1 F (36.7 C) (Oral)   Resp 18   Ht 5\' 7"  (1.702 m)   Wt 74.8 kg   SpO2 100%   BMI 25.84 kg/m  Physical Exam General: resting comfortably, NAD Lungs: normal WOB Psych: currently calm and resting  ED Course / MDM  EKG:   I have reviewed the labs performed to date as well as medications administered while in observation.  Recent changes in the last 24 hours include none reported.  Plan  Current plan is for inpatient psych care.  Mario Lloyd is not under involuntary commitment.     Vern Claude, DO 01/11/21 1132

## 2021-01-12 ENCOUNTER — Encounter (HOSPITAL_COMMUNITY): Payer: Self-pay | Admitting: Family Medicine

## 2021-01-12 ENCOUNTER — Other Ambulatory Visit: Payer: Self-pay

## 2021-01-12 DIAGNOSIS — F1210 Cannabis abuse, uncomplicated: Secondary | ICD-10-CM | POA: Diagnosis present

## 2021-01-12 DIAGNOSIS — F203 Undifferentiated schizophrenia: Secondary | ICD-10-CM | POA: Diagnosis not present

## 2021-01-12 DIAGNOSIS — F1220 Cannabis dependence, uncomplicated: Secondary | ICD-10-CM | POA: Diagnosis present

## 2021-01-12 LAB — LIPID PANEL
Cholesterol: 131 mg/dL (ref 0–200)
HDL: 40 mg/dL — ABNORMAL LOW (ref >40)
LDL Cholesterol: 82 mg/dL (ref 0–99)
Total CHOL/HDL Ratio: 3.3 RATIO
Triglycerides: 47 mg/dL (ref <150)
VLDL: 9 mg/dL (ref 0–40)

## 2021-01-12 MED ORDER — BENZTROPINE MESYLATE 0.5 MG PO TABS: 0.5000 mg | ORAL_TABLET | Freq: Four times a day (QID) | ORAL | Status: DC | PRN

## 2021-01-12 MED ORDER — ARIPIPRAZOLE ER 400 MG IM SRER
400.0000 mg | INTRAMUSCULAR | Status: DC
  Filled 2021-01-12: qty 2

## 2021-01-12 MED ORDER — LORAZEPAM 1 MG PO TABS: 1.0000 mg | ORAL_TABLET | ORAL | Status: DC | PRN

## 2021-01-12 MED ORDER — ZIPRASIDONE MESYLATE 20 MG IM SOLR: 20.0000 mg | INTRAMUSCULAR | Status: DC | PRN

## 2021-01-12 MED ORDER — DIPHENHYDRAMINE HCL 50 MG/ML IJ SOLN: 50.0000 mg | Freq: Four times a day (QID) | INTRAMUSCULAR | Status: DC | PRN

## 2021-01-12 MED ORDER — HALOPERIDOL 5 MG PO TABS
5.0000 mg | ORAL_TABLET | Freq: Every day | ORAL | Status: DC
  Administered 2021-01-13 – 2021-01-16 (×4): 5 mg via ORAL
  Filled 2021-01-12 (×7): qty 1

## 2021-01-12 MED ORDER — OLANZAPINE 10 MG PO TBDP: 10.0000 mg | ORAL_TABLET | Freq: Three times a day (TID) | ORAL | Status: DC | PRN

## 2021-01-12 NOTE — BHH Counselor (Signed)
Adult Comprehensive Assessment  Patient ID: Mario Lloyd, male   DOB: 11/02/2000, 20 y.o.   MRN: [id number]  Information Source: Information source: Patient  Current Stressors:  Patient states their primary concerns and needs for treatment are:: "Anxiety and depression" Patient states their goals for this hospitilization and ongoing recovery are:: "Nothinh really" Educational / Learning stressors: Denies stressor Employment / Job issues: "Doing a lot of jobs, worked 7 jobs, but I keep getting fired cause I have no transportation" Family Relationships: Denies Metallurgist / Lack of resources (include bankruptcy): No income Housing / Lack of housing: Recently told that he could not return home to live with father Physical health (include injuries & life threatening diseases): Denies stressor Social relationships: Denies stressor Substance abuse: Denies stressor Bereavement / Loss: Denies stressor  Living/Environment/Situation:  Living Arrangements: Alone, Other (Comment) Living conditions (as described by patient or guardian): Pt was living with father and stated he has lived their his entire life Who else lives in the home?: Was living with father, step-mother, and step-brother How long has patient lived in current situation?: Whole life, however states he was told he cannot return. Believes that if he continues treatment they may allow him to return What is atmosphere in current home: Temporary  Family History:  Marital status: Single Are you sexually active?: No What is your sexual orientation?: Heterosexual Has your sexual activity been affected by drugs, alcohol, medication, or emotional stress?: Denies Does patient have children?: No  Childhood History:  By whom was/is the patient raised?: Father, Grandparents Additional childhood history information: Pt stated his mother was never around. States he had a good childhood Description of patient's relationship with  caregiver when they were a child: Good Patient's description of current relationship with people who raised him/her: "Pretty straight" How were you disciplined when you got in trouble as a child/adolescent?: "Sent to room for the whole day" Does patient have siblings?: Yes Number of Siblings: 3 Description of patient's current relationship with siblings: 2 little sisters he has pretty good relationships with and a step-brother whom he does not talk to Did patient suffer any verbal/emotional/physical/sexual abuse as a child?: No Did patient suffer from severe childhood neglect?: No Has patient ever been sexually abused/assaulted/raped as an adolescent or adult?: No Was the patient ever a victim of a crime or a disaster?: No Witnessed domestic violence?: No Has patient been affected by domestic violence as an adult?: No  Education:  Highest grade of school patient has completed: Graduated high school, states he had to repeat senior year Currently a student?: No Learning disability?: No  Employment/Work Situation:   Employment Situation: Unemployed Patient's Job has Been Impacted by Current Illness: No What is the Longest Time Patient has Held a Job?: 6 months Where was the Patient Employed at that Time?: [employer] Has Patient ever Been in the [employer]?: No  Financial Resources:   Financial resources: No income Does patient have a Lawyer or guardian?: No  Alcohol/Substance Abuse:   What has been your use of drugs/alcohol within the last 12 months?: Daily THC use If attempted suicide, did drugs/alcohol play a role in this?: No Alcohol/Substance Abuse Treatment Hx: Denies past history Has alcohol/substance abuse ever caused legal problems?: No  Social Support System:   Conservation officer, nature Support System: Fair Development worker, community Support System: Family Type of faith/religion: None How does patient's faith help to cope with current illness?: n/a  Leisure/Recreation:   Do You  Have Hobbies?: Yes Leisure and Hobbies:  making music, playing ball  Strengths/Needs:   What is the patient's perception of their strengths?: "Making music" Patient states they can use these personal strengths during their treatment to contribute to their recovery: "Express myself" Patient states these barriers may affect/interfere with their treatment: None Patient states these barriers may affect their return to the community: Unsure if he is able to return home Other important information patient would like considered in planning for their treatment: None  Discharge Plan:   Currently receiving community mental health services: Yes (From Whom) ([facility]) Patient states concerns and preferences for aftercare planning are: Pt would like to continue medication management with [name] at [facility]. Pt is uninterested in therapy at this time. Patient states they will know when they are safe and ready for discharge when: Yes Does patient have access to transportation?: No Does patient have financial barriers related to discharge medications?: No Patient description of barriers related to discharge medications: Has insurance Plan for no access to transportation at discharge: CSW will continue to assess Will patient be returning to same living situation after discharge?:  (Unsure at this time)  Summary/Recommendations:   Summary and Recommendations (to be completed by the evaluator): Kristan Votta was admitted due to altered mental status. Pt has a hx of psychosis and MDD. Recent stressors include lack of employment and income, lack of stable housing. Pt currently sees [facility] for medication management. While here, Jie Stickels can benefit from crisis stabilization, medication management, therapeutic milieu, and referrals for services.  Velora Horstman A Sharonna Vinje. 01/12/2021

## 2021-01-12 NOTE — Plan of Care (Signed)
Patient was admitted and oriented to the unit. Ate dinner and went to bed. Currently sleeping without any sign of distress. Safety monitored as ordered.

## 2021-01-12 NOTE — BHH Group Notes (Signed)
LCSW Aftercare Discharge Planning Group Note   Type of Group and Topic: Psychoeducational Group: Discharge Planning   Participation Level: Did Not Attend   Description of Group: Discharge planning group reviews patient's anticipated discharge plans and assists patients to anticipate and address any barriers to wellness/recovery in the community. Suicide prevention education is reviewed with patients in group.   Therapeutic Goals 1. Patients will state their anticipated discharge plan and mental health aftercare 2. Patients will identify potential barriers to wellness in the community setting 3. Patients will engage in problem solving, solution focused discussion of ways to anticipate and address barriers to wellness/recovery       Therapeutic Modalities: Motivational Interviewing   Verlinda Slotnick M Rahim Astorga, LCSWA 

## 2021-01-12 NOTE — Progress Notes (Signed)
Recreation Therapy Notes  Date: 8.26.22 Time: 0930 Location: 300 Hall Dayroom  Group Topic: Stress Management   Goal Area(s) Addresses:  Patient will actively participate in stress management techniques presented during session.  Patient will successfully identify benefit of practicing stress management post d/c.   Intervention: Relaxation exercise with ambient sound and script   Activity: Guided Imagery. LRT provided education, instruction, and demonstration on practice of visualization via guided imagery. Patients was asked to participate in the technique introduced during session. Patients were given suggestions of ways to access scripts post d/c and encouraged to explore Youtube and other apps available on smartphones, tablets, and computers.  Education:  Stress Management, Discharge Planning.   Education Outcome: Acknowledges education  Clinical Observations/Feedback: Patient did not attend group session.    Lynden Flemmer, LRT/CTRS         Otila Starn A 01/12/2021 10:55 AM 

## 2021-01-12 NOTE — Plan of Care (Signed)
Progress note  Pt found in bed; compliant with medication administration. Pt seemed paranoid with medications being explained, but felt like the desired effect was something they would benefit from. Pt is guarded but pleasant with questioning. Pt does seem to be preoccupied at times but dies avh. Pt denies si/hi/ah/vh and verbally agrees to approach staff if these become apparent or before harming themselves/others while at bhh.  A: Pt provided support and encouragement. Pt given medication per protocol and standing orders. Q54m safety checks implemented and continued.  R: Pt safe on the unit. Will continue to monitor.   Problem: Education: Goal: Knowledge of [facility] Education information/materials will improve Outcome: Progressing   Problem: Physical Regulation: Goal: Ability to maintain clinical measurements within normal limits will improve Outcome: Progressing   Problem: Safety: Goal: Periods of time without injury will increase Outcome: Progressing

## 2021-01-12 NOTE — Progress Notes (Signed)
   01/12/21 2020  Psych Admission Type (Psych Patients Only)  Admission Status Involuntary  Psychosocial Assessment  Patient Complaints None  Eye Contact Brief  Facial Expression Anxious  Affect Anxious;Preoccupied;Inconsistent with thought content (smiling inappropriately)  Speech Slow  Interaction Assertive  Motor Activity Fidgety;Slow  Appearance/Hygiene Unremarkable  Behavior Characteristics Cooperative  Mood Anxious;Suspicious;Pleasant  Thought Administrator, sports thinking  Content WDL  Delusions None reported or observed  Perception WDL  Hallucination None reported or observed  Judgment Poor  Confusion None  Danger to Self  Current suicidal ideation? Denies  Danger to Others  Danger to Others None reported or observed   Pt seen at nurse's station. Denies SI, HI, AVH and pain. Says he did not want to attend groups so didn't go to any today. Pt smiling inappropriately. Communicated to pt that groups was part of the treatment process. His attendance didn't mean he had to speak. Providers would want to know he was attending as part of criteria for discharge. Pt then decided to attend AA group this evening.

## 2021-01-12 NOTE — [facility] Suicide Risk Assessment (Signed)
[facility] Admission Suicide Risk Assessment   Nursing information obtained from:  Patient Demographic factors:  Male, Unemployed, Low socioeconomic status, Adolescent or young adult Current Mental Status:  NA Loss Factors:  NA Historical Factors:  NA Risk Reduction Factors:  NA  Total Time spent with patient: 30 minutes Principal Problem: Schizophrenia, undifferentiated ([place]) Diagnosis:  Principal Problem:   Schizophrenia, undifferentiated ([place])  Subjective Data: Medical record reviewed.  Patient's case discussed in detail with nursing staff and members of the treatment team.  I met with and evaluated the patient on the unit today.  Mario Lloyd is a 20 year old male with history of schizophrenia, depression, anxiety and substance use (THC) who presented accompanied by his parents as a walk-in to [facility] on 01/10/2021 due to symptoms of increased irritability, agitation, auditory and visual hallucinations, paranoia and feeling he is being watched and may be in danger.  Patient's parents reported that patient made statements to others that he felt his father was going to kill him.  Patient's father reported that patient had missed 2 of his monthly Abilify Maintena injections in recent months and had become more aggressive with family and expressed fears that family members wanted to shoot him.  Patient endorsed irritability, easy anger, visual hallucinations of seeing colors, auditory hallucinations of "creepy voices, whispers, traumatizing voices", trouble sleeping, trouble concentrating and ongoing arguments with his family.  He endorsed daily cannabis use.  Patient's parents reported at [facility] that patient does well when he is consistent with Abilify Maintena injections.  Per chart review most recent prescribed outpatient medications include Abilify Maintena 400 mg IM q. 28 days (next due 01/17/2021); Depakote 250 mg twice daily; Haldol 2 mg p.o. twice daily.  Exact date of most recent Abilify Maintena  injection is unclear.  Patient was transferred to [facility] for medical clearance.  UDS was positive for THC and BAL was undetectable.  Head CT on 01/11/2021 showed no acute intracranial abnormality.  Patient was placed on IVC and was transferred to [facility] early this morning for further inpatient psychiatric treatment and stabilization of psychotic symptoms.  On evaluation with me today, patient is pleasant but guarded and a poor historian.  His thought processes are disorganized and vague and there is paucity of thought content offered/elicited.  Patient states that he is here for "stress help" and tells me he has had problems with his mind for years, since 2016.  Patient has difficulty describing or providing any details regarding his symptoms.  He initially denies AH but later endorses AHs of "wraparound sounds" that "clutter" his mind and make it difficult for him to think.  Patient states that his parents wanted him to come to the hospital but he feels they "over exaggerate" his symptoms.  He does acknowledge that he may have been screaming at home.  Patient makes a vague paranoid statement that he may have been in danger at home because of his father.  Patient denies thought insertion, thought control, thought withdrawal, thought broadcasting, receipt of special messages, other first rank symptoms, VH, SI, AI or HI.  He denies experiencing anxiety, depression or irritability.  Patient reports that he has been experiencing increased sleep, decreased energy and trouble concentrating.  He denies drug use other than daily marijuana and denies alcohol use.  Patient reports most recently receiving his Abilify injection 2 to 3 weeks ago.  He says he has not been taking outpatient Depakote, trazodone, Atarax or albuterol.  He states that he takes Celexa every day does not  know the dose.  Patient states that he has experienced psychiatric symptoms at least as early as age 56.  From chart review, patient has prior psychiatric  diagnoses of schizophrenia, major depressive disorder with psychotic features, bipolar disorder, substance-induced mood disorder.  Patient reports a history of at least 3 prior inpatient psychiatric hospitalizations.  Review of medical records shows at least 2 inpatient psychiatric hospitalizations in 2017 and 2018 to [facility] for psychosis.  Chart review indicates prior treatment with Zyprexa, Celexa, Depakote, Abilify, Abilify Maintena LAI.  Patient denies any history of suicide attempts.  He is not able to tell me his diagnosis.  Patient is followed as an outpatient at the [place].  Patient reports last receiving Abilify Maintena injection in early August with next injection due on 01/17/2021.  Continued Clinical Symptoms:  Alcohol Use Disorder Identification Test Final Score (AUDIT): 0 The "Alcohol Use Disorders Identification Test", Guidelines for Use in Primary Care, Second Edition.  World Pharmacologist [facility]). Score between 0-7:  no or low risk or alcohol related problems. Score between 8-15:  moderate risk of alcohol related problems. Score between 16-19:  high risk of alcohol related problems. Score 20 or above:  warrants further diagnostic evaluation for alcohol dependence and treatment.   CLINICAL FACTORS:   Severe Anxiety and/or Agitation Alcohol/Substance Abuse/Dependencies Schizophrenia:   Less than 61 years old Paranoid or undifferentiated type Currently Psychotic Previous Psychiatric Diagnoses and Treatments   Musculoskeletal: Strength & Muscle Tone: within normal limits Gait & Station: normal Patient leans: N/A  Psychiatric Specialty Exam:  Presentation  General Appearance: Disheveled  Eye Contact:Fair  Speech:Clear and Coherent  Speech Volume:Normal  Handedness:Right   Mood and Affect  Mood:Anxious; Irritable  Affect:Constricted   Thought Process  Thought Processes:Disorganized (Vague)  Descriptions of  Associations:Loose  Orientation:Full (Time, Place and Person)  Thought Content:Paranoid Ideation; Other (comment) (Paucity of thought content elicited/offered)  History of Schizophrenia/Schizoaffective disorder:Yes  Duration of Psychotic Symptoms:Greater than six months  Hallucinations:Hallucinations: Auditory; Visual Description of Auditory Hallucinations: Unable to describe Description of Visual Hallucinations: Unable to describe  Ideas of Reference:Paranoia  Suicidal Thoughts:Suicidal Thoughts: No  Homicidal Thoughts:Homicidal Thoughts: No   Sensorium  Memory:Immediate Fair; Recent Fair  Judgment:Impaired  Insight:Shallow   Executive Functions  Concentration:Fair  Attention Span:Fair  [place]   Psychomotor Activity  Psychomotor Activity:Psychomotor Activity: Normal   Assets  Assets:Desire for Improvement; Leisure Time; Resilience; Social Support   Sleep  Sleep:Sleep: Fair Number of Hours of Sleep: 5    Physical Exam: Physical Exam Vitals and nursing note reviewed.  Constitutional:      General: He is not in acute distress.    Appearance: Normal appearance. He is not diaphoretic.  HENT:     Head: Normocephalic and atraumatic.  Cardiovascular:     Rate and Rhythm: Normal rate.  Pulmonary:     Effort: Pulmonary effort is normal.  Neurological:     General: No focal deficit present.     Mental Status: He is alert and oriented to person, place, and time.   Review of Systems  Constitutional:  Negative for diaphoresis and fever.  HENT:  Negative for sore throat.   Respiratory:  Negative for cough and shortness of breath.   Cardiovascular:  Negative for chest pain and palpitations.  Gastrointestinal:  Negative for constipation, diarrhea, nausea and vomiting.  Genitourinary:  Negative for dysuria.  Musculoskeletal:  Negative for myalgias.  Skin:  Negative for rash.  Neurological:  Negative  for  dizziness, tremors, seizures and headaches.  Psychiatric/Behavioral:  Positive for hallucinations and substance abuse. Negative for depression and suicidal ideas. The patient is nervous/anxious. The patient does not have insomnia.   All other systems reviewed and are negative. Blood pressure 127/76, pulse 71, temperature 98 F (36.7 C), temperature source Oral, resp. rate 16, height 5' 7"  (1.702 m), weight 73.5 kg, SpO2 100 %. Body mass index is 25.37 kg/m.   COGNITIVE FEATURES THAT CONTRIBUTE TO RISK:  Loss of executive function    SUICIDE RISK:   Moderate:  Frequent suicidal ideation with limited intensity, and duration, some specificity in terms of plans, no associated intent, good self-control, limited dysphoria/symptomatology, some risk factors present, and identifiable protective factors, including available and accessible social support.  PLAN OF CARE: Patient is a 20 year old male admitted with worsening psychotic symptoms in the context of incomplete medication adherence and daily cannabis use.  Diagnosis is most consistent with schizophrenia, undifferentiated type and cannabis use disorder.  Patient has been admitted to the 400 inpatient unit for crisis stabilization and treatment of psychotic symptoms.  We will continue every 15-minute observation level.  Encourage participation in group therapy and therapeutic milieu.  Available lab results reviewed.  CMP showed glucose of 121, albumin of 5.4, total protein of 8.6 and otherwise WNL.  Lipid panel showed HDL of 40 and otherwise WNL.  CBC and differential were WNL.  Valproic acid level was <10.  Hemoglobin A1c was 5.4.  TSH was 0.604.  BAL was not performed.  Influenza A, influenza B and coronavirus testing were negative.  UDS was positive for THC.  Head CT on 01/11/2021 showed no acute intracranial abnormality.  I have placed order for Abilify Maintena 400 mg IM q. 28 days with the next dose due on 01/17/2021.  We will restart Depakote DR 250  mg every 12 hours and Haldol 2 mg twice daily that patient was prescribed as an outpatient.  Agitation protocol has been ordered.  Trazodone 50 mg nightly as needed for sleep and hydroxyzine 25 mg 3 times daily as needed for anxiety have been prescribed.  See MAR.  Estimated length of stay 5 to 7 days.  I certify that inpatient services furnished can reasonably be expected to improve the patient's condition.   Arthor Captain, MD 01/12/2021, 3:14 PM

## 2021-01-12 NOTE — H&P (Signed)
Psychiatric Admission Assessment Adult  Patient Identification: Mario Lloyd MRN:  [id number] Date of Evaluation:  01/12/2021 Chief Complaint:  Schizophrenia ([place]) [F20.9] Principal Diagnosis: Schizophrenia, undifferentiated ([place]) Diagnosis:  Principal Problem:   Schizophrenia, undifferentiated ([place]) Active Problems:   Moderate cannabis use disorder (HCC)  History of Present Illness: Medical record reviewed.  Patient's case discussed in detail with nursing staff and members of the treatment team.  I met with and evaluated the patient on the unit today.  Mario Lloyd. Mario Lloyd is a 20 year old male with history of schizophrenia, depression, anxiety and substance use (THC) who presented accompanied by his parents as a walk-in to [facility] on 01/10/2021 due to symptoms of increased irritability, agitation, auditory and visual hallucinations, paranoia and feeling he is being watched and may be in danger.  Patient's parents reported that patient made statements to others that he felt his father was going to kill him.  Patient's father reported that patient had missed 2 of his monthly Abilify Maintena injections in recent months and had become more aggressive with family and expressed fears that family members wanted to shoot him.  Patient endorsed irritability, easy anger, visual hallucinations of seeing colors, auditory hallucinations of "creepy voices, whispers, traumatizing voices", trouble sleeping, trouble concentrating and ongoing arguments with his family.  He endorsed daily cannabis use.  Patient's parents reported at [facility] that patient does well when he is consistent with Abilify Maintena injections.  Per chart review most recent prescribed outpatient medications include Abilify Maintena 400 mg IM q. 28 days (next due 01/17/2021); Depakote 250 mg twice daily; Haldol 2 mg p.o. twice daily.  Exact date of most recent Abilify Maintena injection is unclear.  Patient was transferred to [facility] for medical clearance.   UDS was positive for THC and BAL was undetectable.  Head CT on 01/11/2021 showed no acute intracranial abnormality.  Patient was placed on IVC and was transferred to [facility] early this morning for further inpatient psychiatric treatment and stabilization of psychotic symptoms.  On evaluation with me today, patient is pleasant but guarded and a poor historian.  His thought processes are disorganized and vague and there is paucity of thought content offered/elicited.  Patient states that he is here for "stress help" and tells me he has had problems with his mind for years, since 2016.  Patient has difficulty describing or providing any details regarding his symptoms.  He initially denies AH but later endorses AHs of "wraparound sounds" that "clutter" his mind and make it difficult for him to think.  Patient states that his parents wanted him to come to the hospital but he feels they "over exaggerate" his symptoms.  He does acknowledge that he may have been screaming at home.  Patient makes a vague paranoid statement that he may have been in danger at home because of his father.  Patient denies thought insertion, thought control, thought withdrawal, thought broadcasting, receipt of special messages, other first rank symptoms, VH, SI, AI or HI.  He denies experiencing anxiety, depression or irritability.  Patient reports that he has been experiencing increased sleep, decreased energy and trouble concentrating.  He denies drug use other than daily marijuana and denies alcohol use.  Patient reports most recently receiving his Abilify injection 2 to 3 weeks ago.  He says he has not been taking outpatient Depakote, trazodone, Atarax or albuterol.  He states that he takes Celexa every day does not know the dose.    Associated Signs/Symptoms: Depression Symptoms:  hypersomnia, fatigue, difficulty concentrating, anxiety,  Duration of Depression Symptoms: Greater than two weeks  (Hypo) Manic Symptoms:   Delusions, Distractibility, Hallucinations, Impulsivity, Irritable Mood, Mood lability Anxiety Symptoms:   denies Psychotic Symptoms:  Delusions, Hallucinations: Auditory Visual Paranoia, PTSD Symptoms: NA Total Time spent with patient: 30 minutes  Past Psychiatric History: Patient states that he has experienced psychiatric symptoms at least as early as age 53.  From chart review, patient has prior psychiatric diagnoses of schizophrenia, major depressive disorder with psychotic features, bipolar disorder, substance-induced mood disorder.  Patient reports a history of at least 3 prior inpatient psychiatric hospitalizations.  Review of medical records shows at least 2 inpatient psychiatric hospitalizations in 2017 and 2018 to [facility] for psychosis.  Chart review indicates prior treatment with Zyprexa, Celexa, Depakote, Abilify, Abilify Maintena LAI.  Patient denies any history of suicide attempts.  He is not able to tell me his diagnosis.  Patient is followed as an outpatient at the [facility].  Patient reports last receiving Abilify Maintena injection in early August with next injection due on 01/17/2021.  Is the patient at risk to self? Yes.    Has the patient been a risk to self in the past 6 months? Yes.    Has the patient been a risk to self within the distant past? Yes.    Is the patient a risk to others? No.  Has the patient been a risk to others in the past 6 months? No.  Has the patient been a risk to others within the distant past? No.   Prior Inpatient Therapy:   Prior Outpatient Therapy:    Alcohol Screening: 1. How often do you have a drink containing alcohol?: Never 2. How many drinks containing alcohol do you have on a typical day when you are drinking?: 1 or 2 3. How often do you have six or more drinks on one occasion?: Never AUDIT-C Score: 0 4. How often during the last year have you found that you were not able to stop drinking once you had started?:  Never 5. How often during the last year have you failed to do what was normally expected from you because of drinking?: Never 6. How often during the last year have you needed a first drink in the morning to get yourself going after a heavy drinking session?: Never 7. How often during the last year have you had a feeling of guilt of remorse after drinking?: Never 8. How often during the last year have you been unable to remember what happened the night before because you had been drinking?: Never 9. Have you or someone else been injured as a result of your drinking?: No 10. Has a relative or friend or a doctor or another health worker been concerned about your drinking or suggested you cut down?: No Alcohol Use Disorder Identification Test Final Score (AUDIT): 0 Substance Abuse History in the last 12 months:  Yes.  Chronic daily cannabis use Consequences of Substance Abuse: Chronic daily cannabis use is likely exacerbating psychotic and other symptoms leading to current hospital admission. Previous Psychotropic Medications: Yes  Psychological Evaluations: Yes  Past Medical History:  Past Medical History:  Diagnosis Date   Anxiety    Asthma    Depression    Eczema    Eczema    Psychosis ([place]) 06/07/2015   Substance induced mood disorder ([place]) 06/14/2015   History reviewed. No pertinent surgical history. Family History: History reviewed. No pertinent family history. Family Psychiatric  History: patient denies Tobacco Screening:  Social History:  Social History   Substance and Sexual Activity  Alcohol Use No     Social History   Substance and Sexual Activity  Drug Use Yes   Types: Marijuana   Comment: UDS was negative; Pt + Benzos    Additional Social History: Marital status: Single Are you sexually active?: No What is your sexual orientation?: Heterosexual Has your sexual activity been affected by drugs, alcohol, medication, or emotional stress?: Denies Does patient have  children?: No                         Allergies:  No Known Allergies Lab Results:  Results for orders placed or performed during the hospital encounter of 01/11/21 (from the past 48 hour(s))  Lipid panel     Status: Abnormal   Collection Time: 01/12/21  6:27 AM  Result Value Ref Range   Cholesterol 131 0 - 200 mg/dL   Triglycerides 47 <150 mg/dL   HDL 40 (L) >40 mg/dL   Total CHOL/HDL Ratio 3.3 RATIO   VLDL 9 0 - 40 mg/dL   LDL Cholesterol 82 0 - 99 mg/dL    Comment:        Total Cholesterol/HDL:CHD Risk Coronary Heart Disease Risk Table                     Men   Women  1/2 Average Risk   3.4   3.3  Average Risk       5.0   4.4  2 X Average Risk   9.6   7.1  3 X Average Risk  23.4   11.0        Use the calculated Patient Ratio above and the CHD Risk Table to determine the patient's CHD Risk.        ATP III CLASSIFICATION (LDL):  <100     mg/dL   Optimal  100-129  mg/dL   Near or Above                    Optimal  130-159  mg/dL   Borderline  160-189  mg/dL   High  >190     mg/dL   Very High Performed at [facility] [address] [place], [place] [address]     Blood Alcohol level:  Lab Results  Component Value Date   [facility] <10 01/10/2021   ETH <10 93/81/0175    Metabolic Disorder Labs:  Lab Results  Component Value Date   HGBA1C 5.4 01/10/2021   MPG 108.28 01/10/2021   MPG 105 08/14/2016   Lab Results  Component Value Date   PROLACTIN 0.3 (L) 01/10/2021   PROLACTIN 8.1 08/14/2016   Lab Results  Component Value Date   CHOL 131 01/12/2021   TRIG 47 01/12/2021   HDL 40 (L) 01/12/2021   CHOLHDL 3.3 01/12/2021   VLDL 9 01/12/2021   LDLCALC 82 01/12/2021   LDLCALC 61 08/14/2016    Current Medications: Current Facility-Administered Medications  Medication Dose Route Frequency Provider Last Rate Last Admin   acetaminophen (TYLENOL) tablet 650 mg  650 mg Oral Q6H PRN Chalmers Guest, NP       alum & mag hydroxide-simeth  (MAALOX/MYLANTA) 200-200-20 MG/5ML suspension 30 mL  30 mL Oral Q4H PRN Chalmers Guest, NP       [START ON 01/17/2021] ARIPiprazole ER (ABILIFY MAINTENA) injection 400 mg  400 mg Intramuscular Q28 days Ethelene Browns  L, MD       divalproex (DEPAKOTE) DR tablet 250 mg  250 mg Oral Q12H Chalmers Guest, NP   250 mg at 01/12/21 2297   haloperidol (HALDOL) tablet 2 mg  2 mg Oral BID Chalmers Guest, NP   2 mg at 01/12/21 9892   hydrOXYzine (ATARAX/VISTARIL) tablet 25 mg  25 mg Oral TID PRN Chalmers Guest, NP       OLANZapine zydis (ZYPREXA) disintegrating tablet 10 mg  10 mg Oral Q8H PRN Arthor Captain, MD       And   LORazepam (ATIVAN) tablet 1 mg  1 mg Oral PRN Arthor Captain, MD       And   ziprasidone (GEODON) injection 20 mg  20 mg Intramuscular PRN Arthor Captain, MD       magnesium hydroxide (MILK OF MAGNESIA) suspension 30 mL  30 mL Oral Daily PRN Chalmers Guest, NP       traZODone (DESYREL) tablet 50 mg  50 mg Oral QHS PRN Chalmers Guest, NP       PTA Medications: Medications Prior to Admission  Medication Sig Dispense Refill Last Dose   ABILIFY MAINTENA 400 MG SRER injection Inject 2 mLs (400 mg total) into the muscle every 30 (thirty) days. Next dose due 01/17/21 1 each     albuterol (VENTOLIN HFA) 108 (90 Base) MCG/ACT inhaler Inhale 2 puffs into the lungs every 4 (four) hours as needed for wheezing or shortness of breath. (Patient not taking: No sig reported) 18 g 0    citalopram (CELEXA) 10 MG tablet Take 10 mg by mouth daily.      divalproex (DEPAKOTE) 250 MG DR tablet Take 1 tablet (250 mg total) by mouth every 12 (twelve) hours. (Patient not taking: No sig reported)      hydrOXYzine (ATARAX/VISTARIL) 25 MG tablet Take 1 tablet (25 mg total) by mouth 3 (three) times daily as needed for anxiety. (Patient not taking: No sig reported) 30 tablet 0    traZODone (DESYREL) 50 MG tablet Take 1 tablet (50 mg total) by mouth at bedtime and may repeat dose one time if needed. (Patient not taking:  No sig reported)       Musculoskeletal: Strength & Muscle Tone: within normal limits Gait & Station: normal Patient leans: N/A            Psychiatric Specialty Exam:  Presentation  General Appearance: Disheveled  Eye Contact:Fair  Speech:Clear and Coherent  Speech Volume:Normal  Handedness:Right   Mood and Affect  Mood:Anxious; Irritable  Affect:Constricted   Thought Process  Thought Processes:Disorganized (Vague)  Duration of Psychotic Symptoms: Greater than six months  Past Diagnosis of Schizophrenia or Psychoactive disorder: Yes  Descriptions of Associations:Loose  Orientation:Full (Time, Place and Person)  Thought Content:Paranoid Ideation; Other (comment) (Paucity of thought content elicited/offered)  Hallucinations:Hallucinations: Auditory; Visual Description of Auditory Hallucinations: Unable to describe Description of Visual Hallucinations: Unable to describe  Ideas of Reference:Paranoia  Suicidal Thoughts:Suicidal Thoughts: No  Homicidal Thoughts:Homicidal Thoughts: No   Sensorium  Memory:Immediate Fair; Recent Fair  Judgment:Impaired  Insight:Shallow   Executive Functions  Concentration:Fair  Attention Span:Fair  [place]   Psychomotor Activity  Psychomotor Activity:Psychomotor Activity: Normal   Assets  Assets:Desire for Improvement; Leisure Time; Resilience; Social Support   Sleep  Sleep:Sleep: Fair Number of Hours of Sleep: 5    Physical Exam: Physical Exam Vitals and nursing note reviewed.  Constitutional:  General: He is not in acute distress.    Appearance: Normal appearance. He is not diaphoretic.  HENT:     Head: Normocephalic and atraumatic.  Cardiovascular:     Rate and Rhythm: Normal rate.  Pulmonary:     Effort: Pulmonary effort is normal.  Neurological:     General: No focal deficit present.     Mental Status: He is alert and oriented to  person, place, and time.   ROS Constitutional:  Negative for diaphoresis and fever.  HENT:  Negative for sore throat.   Respiratory:  Negative for cough and shortness of breath.   Cardiovascular:  Negative for chest pain and palpitations.  Gastrointestinal:  Negative for constipation, diarrhea, nausea and vomiting.  Genitourinary:  Negative for dysuria.  Musculoskeletal:  Negative for myalgias.  Skin:  Negative for rash.  Neurological:  Negative for dizziness, tremors, seizures and headaches.  Psychiatric/Behavioral:  Positive for hallucinations and substance abuse. Negative for depression and suicidal ideas. The patient is nervous/anxious. The patient does not have insomnia.   All other systems reviewed and are negative. Blood pressure 127/76, pulse 71, temperature 98 F (36.7 C), temperature source Oral, resp. rate 16, height 5' 7"  (1.702 m), weight 73.5 kg, SpO2 100 %. Body mass index is 25.37 kg/m.  Treatment Plan Summary:  Patient is a 20 year old male admitted with worsening psychotic symptoms in the context of incomplete medication adherence and daily cannabis use.  Diagnosis is most consistent with schizophrenia, undifferentiated type and cannabis use disorder.  Patient has been admitted to the [facility] inpatient unit for crisis stabilization and treatment of psychotic symptoms.    Daily contact with patient to assess and evaluate symptoms and progress in treatment and Medication management  Continue IVC status.  Second QPE completed today by this Probation officer.  Observation Level/Precautions:  15 minute checks  Laboratory:  CBC Chemistry Profile HbAIC UDS Lipid panel, TSH Available lab results reviewed.  CMP showed glucose of 121, albumin of 5.4, total protein of 8.6 and otherwise WNL.  Lipid panel showed HDL of 40 and otherwise WNL.  CBC and differential were WNL.  Valproic acid level was <10.  Hemoglobin A1c was 5.4.  TSH was 0.604.  BAL was not performed.  Influenza A, influenza B and  coronavirus testing were negative.  UDS was positive for THC.    Head CT on 01/11/2021 showed no acute intracranial abnormality.    Psychotherapy: Encourage participation in group therapy and therapeutic milieu.  Medications: See MAR. Order placed for Abilify Maintena 400 mg IM q. 28 days with the next dose due on 01/17/2021.  We will restart Depakote DR 250 mg every 12 hours and Haldol 2 mg twice daily that patient was prescribed as an outpatient.  Agitation protocol has been ordered.  Trazodone 50 mg nightly as needed for sleep and hydroxyzine 25 mg 3 times daily as needed for anxiety have been prescribed.   Consultations:    Discharge Concerns: Housing plan needs to be clarified  Estimated LOS: 5 to 7 days  Other:     Physician Treatment Plan for Primary Diagnosis: Schizophrenia, undifferentiated ([place]) Long Term Goal(s): Improvement in symptoms so as ready for discharge  Short Term Goals: Ability to identify changes in lifestyle to reduce recurrence of condition will improve, Ability to verbalize feelings will improve, Ability to disclose and discuss suicidal ideas, Ability to demonstrate self-control will improve, Ability to identify and develop effective coping behaviors will improve, Ability to maintain clinical measurements within normal limits will  improve, Compliance with prescribed medications will improve, and Ability to identify triggers associated with substance abuse/mental health issues will improve  Physician Treatment Plan for Secondary Diagnosis: Principal Problem:   Schizophrenia, undifferentiated ([place]) Active Problems:   Moderate cannabis use disorder (HCC)  Long Term Goal(s): Improvement in symptoms so as ready for discharge  Short Term Goals: Ability to identify changes in lifestyle to reduce recurrence of condition will improve, Ability to verbalize feelings will improve, Ability to disclose and discuss suicidal ideas, Ability to demonstrate self-control will improve,  Ability to identify and develop effective coping behaviors will improve, Ability to maintain clinical measurements within normal limits will improve, Compliance with prescribed medications will improve, and Ability to identify triggers associated with substance abuse/mental health issues will improve  I certify that inpatient services furnished can reasonably be expected to improve the patient's condition.    Arthor Captain, MD 8/26/20224:02 PM

## 2021-01-12 NOTE — Tx Team (Signed)
Interdisciplinary Treatment and Diagnostic Plan Update  01/12/2021 Time of Session:  Mario Lloyd MRN: [id number]  Principal Diagnosis: Schizophrenia [facility])  Secondary Diagnoses: Principal Problem:   Schizophrenia ([place])   Current Medications:  Current Facility-Administered Medications  Medication Dose Route Frequency Provider Last Rate Last Admin   acetaminophen (TYLENOL) tablet 650 mg  650 mg Oral Q6H PRN Chalmers Guest, NP       alum & mag hydroxide-simeth (MAALOX/MYLANTA) 200-200-20 MG/5ML suspension 30 mL  30 mL Oral Q4H PRN Chalmers Guest, NP       Derrill Memo ON 01/17/2021] ARIPiprazole ER (ABILIFY MAINTENA) injection 400 mg  400 mg Intramuscular Once Chalmers Guest, NP       divalproex (DEPAKOTE) DR tablet 250 mg  250 mg Oral Q12H Chalmers Guest, NP   250 mg at 01/12/21 4098   haloperidol (HALDOL) tablet 2 mg  2 mg Oral BID Chalmers Guest, NP   2 mg at 01/12/21 1191   hydrOXYzine (ATARAX/VISTARIL) tablet 25 mg  25 mg Oral TID PRN Chalmers Guest, NP       OLANZapine zydis (ZYPREXA) disintegrating tablet 10 mg  10 mg Oral Q8H PRN Arthor Captain, MD       And   LORazepam (ATIVAN) tablet 1 mg  1 mg Oral PRN Arthor Captain, MD       And   ziprasidone (GEODON) injection 20 mg  20 mg Intramuscular PRN Arthor Captain, MD       magnesium hydroxide (MILK OF MAGNESIA) suspension 30 mL  30 mL Oral Daily PRN Chalmers Guest, NP       traZODone (DESYREL) tablet 50 mg  50 mg Oral QHS PRN Chalmers Guest, NP       PTA Medications: Medications Prior to Admission  Medication Sig Dispense Refill Last Dose   ABILIFY MAINTENA 400 MG SRER injection Inject 2 mLs (400 mg total) into the muscle every 30 (thirty) days. Next dose due 01/17/21 1 each     albuterol (VENTOLIN HFA) 108 (90 Base) MCG/ACT inhaler Inhale 2 puffs into the lungs every 4 (four) hours as needed for wheezing or shortness of breath. (Patient not taking: No sig reported) 18 g 0    citalopram (CELEXA) 10 MG tablet Take 10 mg by mouth  daily.      divalproex (DEPAKOTE) 250 MG DR tablet Take 1 tablet (250 mg total) by mouth every 12 (twelve) hours. (Patient not taking: No sig reported)      hydrOXYzine (ATARAX/VISTARIL) 25 MG tablet Take 1 tablet (25 mg total) by mouth 3 (three) times daily as needed for anxiety. (Patient not taking: No sig reported) 30 tablet 0    traZODone (DESYREL) 50 MG tablet Take 1 tablet (50 mg total) by mouth at bedtime and may repeat dose one time if needed. (Patient not taking: No sig reported)       Patient Stressors: Financial difficulties Medication change or noncompliance Substance abuse  Patient Strengths: Ability for insight Curator fund of knowledge Physical Health  Treatment Modalities: Medication Management, Group therapy, Case management,  1 to 1 session with clinician, Psychoeducation, Recreational therapy.   Physician Treatment Plan for Primary Diagnosis: Schizophrenia ([place]) Long Term Goal(s):     Short Term Goals:    Medication Management: Evaluate patient's response, side effects, and tolerance of medication regimen.  Therapeutic Interventions: 1 to 1 sessions, Unit Group sessions and Medication administration.  Evaluation of Outcomes: Not Met  Physician  Treatment Plan for Secondary Diagnosis: Principal Problem:   Schizophrenia ([place])  Long Term Goal(s):     Short Term Goals:       Medication Management: Evaluate patient's response, side effects, and tolerance of medication regimen.  Therapeutic Interventions: 1 to 1 sessions, Unit Group sessions and Medication administration.  Evaluation of Outcomes: Not Met   RN Treatment Plan for Primary Diagnosis: Schizophrenia ([place]) Long Term Goal(s): Knowledge of disease and therapeutic regimen to maintain health will improve  Short Term Goals: Ability to demonstrate self-control and Ability to participate in decision making will improve  Medication Management: RN will administer medications as  ordered by provider, will assess and evaluate patient's response and provide education to patient for prescribed medication. RN will report any adverse and/or side effects to prescribing provider.  Therapeutic Interventions: 1 on 1 counseling sessions, Psychoeducation, Medication administration, Evaluate responses to treatment, Monitor vital signs and CBGs as ordered, Perform/monitor CIWA, COWS, AIMS and Fall Risk screenings as ordered, Perform wound care treatments as ordered.  Evaluation of Outcomes: Not Met   LCSW Treatment Plan for Primary Diagnosis: Schizophrenia ([place]) Long Term Goal(s): Safe transition to appropriate next level of care at discharge, Engage patient in therapeutic group addressing interpersonal concerns.  Short Term Goals: Engage patient in aftercare planning with referrals and resources, Increase emotional regulation, and Facilitate acceptance of mental health diagnosis and concerns  Therapeutic Interventions: Assess for all discharge needs, 1 to 1 time with Social worker, Explore available resources and support systems, Assess for adequacy in community support network, Educate family and significant other(s) on suicide prevention, Complete Psychosocial Assessment, Interpersonal group therapy.  Evaluation of Outcomes: Not Met   Progress in Treatment: Attending groups: No. Participating in groups: No. Taking medication as prescribed: Yes. Toleration medication: Yes. Family/Significant other contact made: No, will contact:  father Patient understands diagnosis: Yes. and No. Discussing patient identified problems/goals with staff: Yes. Medical problems stabilized or resolved: Yes. Denies suicidal/homicidal ideation: Yes. Issues/concerns per patient self-inventory: No. Other: None  New problem(s) identified: No, Describe:  None  New Short Term/Long Term Goal(s):medication stabilization, elimination of SI thoughts, development of comprehensive mental wellness plan.    Patient Goals:  "stay humble"  Discharge Plan or Barriers: Patient recently admitted. CSW will continue to follow and assess for appropriate referrals and possible discharge planning.   Reason for Continuation of Hospitalization: Medication stabilization Withdrawal symptoms  Estimated Length of Stay:3-5 days  Attendees: Patient:Mario Lloyd 01/12/2021 2:47 PM  Physician: Dr. Kai Levins 01/12/2021 2:47 PM  Nursing:  01/12/2021 2:47 PM  RN Care Manager: 01/12/2021 2:47 PM  Social Worker: Toney Reil, [place] 01/12/2021 2:47 PM  Recreational Therapist:  01/12/2021 2:47 PM  Other:  01/12/2021 2:47 PM  Other:  01/12/2021 2:47 PM  Other: 01/12/2021 2:47 PM    Scribe for Treatment Team: Mliss Fritz, [place] 01/12/2021 2:47 PM

## 2021-01-12 NOTE — [facility] Group Notes (Signed)
Patient did not attend Positive Emotion group, although he was made aware of it.

## 2021-01-12 NOTE — Progress Notes (Signed)
Pt did not attend group, Pt was still asleep.
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
## 2021-01-13 DIAGNOSIS — F1220 Cannabis dependence, uncomplicated: Secondary | ICD-10-CM | POA: Diagnosis not present

## 2021-01-13 DIAGNOSIS — F203 Undifferentiated schizophrenia: Secondary | ICD-10-CM | POA: Diagnosis not present

## 2021-01-13 NOTE — [facility] Group Notes (Signed)
LCSW Group Therapy Note  01/13/2021  Type of Therapy and Topic:  Group Therapy - Healthy vs Unhealthy Coping Skills  Participation Level:  Active   Description of Group The focus of this group was to determine what unhealthy coping techniques typically are used by group members and what healthy coping techniques would be helpful in coping with various problems. Patients were guided in becoming aware of the differences between healthy and unhealthy coping techniques. Patients were asked to identify 2-3 healthy coping skills they would like to learn to use more effectively, and many mentioned meditation, breathing, and relaxation. These were explained, samples demonstrated, and resources shared for how to learn more at discharge. At group closing, additional ideas of healthy coping skills were shared in a fun exercise.  Therapeutic Goals Patients learned that coping is what human beings do all day long to deal with various situations in their lives Patients defined and discussed healthy vs unhealthy coping techniques Patients identified their preferred coping techniques and identified whether these were healthy or unhealthy Patients determined 2-3 healthy coping skills they would like to become more familiar with and use more often, and practiced a few medications Patients provided support and ideas to each other   Summary of Patient Progress:  Pt was given packet of worksheets discussing coping skills. Pt was encouraged to seek out CSW if any questions arose while completing the worksheets.    Therapeutic Modalities Cognitive Behavioral Therapy Motivational Interviewing  Aleah Ahlgrim, LCSWA Clinicial Social Worker [facility]   

## 2021-01-13 NOTE — [facility] Group Notes (Signed)
Negative Smoke   Passive but still engaged in group

## 2021-01-13 NOTE — Progress Notes (Addendum)
[facility] MD Progress Note  01/13/2021 4:16 PM Mario Lloyd  MRN:  [id number] Subjective:  " My dad wanted me to come here and get help" Reason for admission:  Mario Lloyd. Stage is a 20 year old male with history of schizophrenia, depression, anxiety and substance use (THC) who presented accompanied by his parents as a walk-in to [facility] on 01/10/2021 due to symptoms of increased irritability, agitation, auditory and visual hallucinations, paranoia and feeling he is being watched and may be in danger. Principal Problem: Schizophrenia, undifferentiated (HCC) Diagnosis: Principal Problem:   Schizophrenia, undifferentiated (HCC) Active Problems:   Moderate cannabis use disorder (HCC) Objective:  Notes reviewed, Report obtained and care reviewed with members of our interdisciplinary team.  Patient was sleeping when provider visited him in his room.  He was taken to the dayroom for evaluation.  Patient, per record patient has a hx of Schizophrenia and takes long Acting Abilify Maintena injection.  He missed his Abilify Maintena injections two months in a row.and gradually decompensated.  He became irritable, agitated and was having A/V/H. Today he was calm and engaged in our conversation.  He denied feeling depressed or anxious today and he also denied AVH as well.  He admitted that he is unemployed, he did not go for his Monthly injection and not sure what changes he will make regarding compliance.  He plans to get a job once he is stable and discharged and he believes he will go back to his parent's home.  He reported that sleep is good and appetite is good as well.  Nursing and therapist notes states he is not participating in group therapy.  Patient denied feeling suicidal and no mention of paranoia. We will continue current plan of care and the goal is to administer his Abilify Maintena injection before discharge.  Encouraged patient to attend and participate in group activities offered in the unit.  Discussed  EPS symptoms and dystonic reaction sand to go the staff for treatment. Blood pressure 125/78, pulse (!) 55, temperature 97.6 F (36.4 C), temperature source Oral, resp. rate 16, height 5\' 7"  (1.702 m), weight 73.5 kg, SpO2 100 %. Total Time spent with patient:  35 minutes  Past Psychiatric History: See H&P note  Past Medical History:  Past Medical History:  Diagnosis Date   Anxiety    Asthma    Depression    Eczema    Eczema    Psychosis (HCC) 06/07/2015   Substance induced mood disorder (HCC) 06/14/2015   History reviewed. No pertinent surgical history. Family History: History reviewed. No pertinent family history. Family Psychiatric  History: see H&P Note Social History:  Social History   Substance and Sexual Activity  Alcohol Use No     Social History   Substance and Sexual Activity  Drug Use Yes   Types: Marijuana   Comment: UDS was negative; Pt + Benzos    Social History   Socioeconomic History   Marital status: Single    Spouse name: Not on file   Number of children: Not on file   Years of education: Not on file   Highest education level: Not on file  Occupational History   Not on file  Tobacco Use   Smoking status: Never    Passive exposure: Never   Smokeless tobacco: Never  Vaping Use   Vaping Use: Never used  Substance and Sexual Activity   Alcohol use: No   Drug use: Yes    Types: Marijuana    Comment: UDS  was negative; Pt + Benzos   Sexual activity: Never  Other Topics Concern   Not on file  Social History Narrative   Not on file   Social Determinants of Health   Financial Resource Strain: Not on file  Food Insecurity: Not on file  Transportation Needs: Not on file  Physical Activity: Not on file  Stress: Not on file  Social Connections: Not on file   Additional Social History:                         Sleep: Good  Appetite:  Good  Current Medications: Current Facility-Administered Medications  Medication Dose Route  Frequency Provider Last Rate Last Admin   acetaminophen (TYLENOL) tablet 650 mg  650 mg Oral Q6H PRN Novella Olive, NP       alum & mag hydroxide-simeth (MAALOX/MYLANTA) 200-200-20 MG/5ML suspension 30 mL  30 mL Oral Q4H PRN Novella Olive, NP       [START ON 01/17/2021] ARIPiprazole ER (ABILIFY MAINTENA) injection 400 mg  400 mg Intramuscular Q28 days Claudie Revering, MD       benztropine (COGENTIN) tablet 0.5 mg  0.5 mg Oral Q6H PRN Claudie Revering, MD       diphenhydrAMINE (BENADRYL) injection 50 mg  50 mg Intramuscular Q6H PRN Claudie Revering, MD       divalproex (DEPAKOTE) DR tablet 250 mg  250 mg Oral Q12H Novella Olive, NP   250 mg at 01/13/21 1610   haloperidol (HALDOL) tablet 5 mg  5 mg Oral Q1200 Claudie Revering, MD   5 mg at 01/13/21 9604   hydrOXYzine (ATARAX/VISTARIL) tablet 25 mg  25 mg Oral TID PRN Novella Olive, NP       OLANZapine zydis (ZYPREXA) disintegrating tablet 10 mg  10 mg Oral Q8H PRN Claudie Revering, MD       And   LORazepam (ATIVAN) tablet 1 mg  1 mg Oral PRN Claudie Revering, MD       And   ziprasidone (GEODON) injection 20 mg  20 mg Intramuscular PRN Claudie Revering, MD       magnesium hydroxide (MILK OF MAGNESIA) suspension 30 mL  30 mL Oral Daily PRN Novella Olive, NP       traZODone (DESYREL) tablet 50 mg  50 mg Oral QHS PRN Novella Olive, NP        Lab Results:  Results for orders placed or performed during the hospital encounter of 01/11/21 (from the past 48 hour(s))  Lipid panel     Status: Abnormal   Collection Time: 01/12/21  6:27 AM  Result Value Ref Range   Cholesterol 131 0 - 200 mg/dL   Triglycerides 47 <540 mg/dL   HDL 40 (L) >98 mg/dL   Total CHOL/HDL Ratio 3.3 RATIO   VLDL 9 0 - 40 mg/dL   LDL Cholesterol 82 0 - 99 mg/dL    Comment:        Total Cholesterol/HDL:CHD Risk Coronary Heart Disease Risk Table                     Men   Women  1/2 Average Risk   3.4   3.3  Average Risk       5.0   4.4  2 X Average Risk   9.6   7.1  3 X  Average Risk  23.4   11.0  Use the calculated Patient Ratio above and the CHD Risk Table to determine the patient's CHD Risk.        ATP III CLASSIFICATION (LDL):  <100     mg/dL   Optimal  967-893  mg/dL   Near or Above                    Optimal  130-159  mg/dL   Borderline  810-175  mg/dL   High  >102     mg/dL   Very High Performed at [facility], [address], [place], [place] [address]     Blood Alcohol level:  Lab Results  Component Value Date   [facility] [place] <10 01/10/2021   ETH <10 01/10/2021    Metabolic Disorder Labs: Lab Results  Component Value Date   HGBA1C 5.4 01/10/2021   MPG 108.28 01/10/2021   MPG 105 08/14/2016   Lab Results  Component Value Date   PROLACTIN 0.3 (L) 01/10/2021   PROLACTIN 8.1 08/14/2016   Lab Results  Component Value Date   CHOL 131 01/12/2021   TRIG 47 01/12/2021   HDL 40 (L) 01/12/2021   CHOLHDL 3.3 01/12/2021   VLDL 9 01/12/2021   LDLCALC 82 01/12/2021   LDLCALC 61 08/14/2016    Physical Findings: AIMS: Facial and Oral Movements Muscles of Facial Expression: None, normal Lips and Perioral Area: None, normal Jaw: None, normal Tongue: None, normal,Extremity Movements Upper (arms, wrists, hands, fingers): None, normal Lower (legs, knees, ankles, toes): None, normal, Trunk Movements Neck, shoulders, hips: None, normal, Overall Severity Severity of abnormal movements (highest score from questions above): None, normal Incapacitation due to abnormal movements: None, normal Patient's awareness of abnormal movements (rate only patient's report): No Awareness, Dental Status Current problems with teeth and/or dentures?: No Does patient usually wear dentures?: No  CIWA:    COWS:     Musculoskeletal: Strength & Muscle Tone: within normal limits Gait & Station: normal Patient leans: Front  Psychiatric Specialty Exam:  Presentation  General Appearance: Casual; Fairly Groomed  Eye  Contact:Fair  Speech:Clear and Coherent; Normal Rate  Speech Volume:Decreased  Handedness:Right   Mood and Affect  Mood:Euthymic  Affect:Congruent   Thought Process  Thought Processes:Coherent  Descriptions of Associations:Intact  Orientation:Full (Time, Place and Person)  Thought Content:Logical  History of Schizophrenia/Schizoaffective disorder:Yes  Duration of Psychotic Symptoms:Greater than six months  Hallucinations:Hallucinations: Other (comment) (Denied) Description of Auditory Hallucinations: Unable to describe Description of Visual Hallucinations: Unable to describe  Ideas of Reference:None  Suicidal Thoughts:Suicidal Thoughts: No  Homicidal Thoughts:Homicidal Thoughts: No   Sensorium  Memory:Immediate Good; Recent Good; Remote Good  Judgment:Poor  Insight:Fair   Executive Functions  Concentration:Fair  Attention Span:Good  Recall:Good  Fund of Knowledge:Good  Language:Good   Psychomotor Activity  Psychomotor Activity:Psychomotor Activity: Normal   Assets  Assets:Communication Skills; Desire for Improvement (not sure of housing and is not sure yet if parents will allow him home.)   Sleep  Sleep:Sleep: Good Number of Hours of Sleep: 6.5    Physical Exam: Physical Exam Vitals and nursing note reviewed.  Constitutional:      Appearance: Normal appearance.  HENT:     Head: Normocephalic and atraumatic.     Nose: Nose normal.  Cardiovascular:     Rate and Rhythm: Bradycardia present.  Pulmonary:     Effort: Pulmonary effort is normal.  Musculoskeletal:        General: Normal range of motion.  Skin:    General: Skin  is warm and dry.  Neurological:     General: No focal deficit present.     Mental Status: He is alert and oriented to person, place, and time.   Review of Systems  Constitutional: Negative.   HENT: Negative.    Eyes: Negative.   Respiratory: Negative.    Cardiovascular: Negative.   Gastrointestinal:  Negative.   Genitourinary: Negative.   Musculoskeletal: Negative.   Skin: Negative.   Blood pressure 125/78, pulse (!) 55, temperature 97.6 F (36.4 C), temperature source Oral, resp. rate 16, height 5\' 7"  (1.702 m), weight 73.5 kg, SpO2 100 %. Body mass index is 25.37 kg/m.   Treatment Plan Summary: Daily contact with patient to assess and evaluate symptoms and progress in treatment and Medication management Continue Haldol 5 mg po daily for psychosis and mental health Continue Depakote 250 mg DR tablet po bid for mood stabilization. Offer Abilify Maintena 400 mg  IM 01/17/2021 [facility] Offer Benztropine 0.5 mg po q 6 hrs as needed for Medication induced EPS. Offer Benadryl injection 50 mg IM q 6 hrs for Dystonic reaction. Offer Trazodone 50 mg po at bed time as needed for sleep  Earney NavyJosephine C Onuoha, NP-PMHNP-BC 01/13/2021, 4:16 PM   I have reviewed the note by NP, and discussed the plan of care.  I am in agreement with the assessment and plan.  Mariel CraftSHEILA M Darrelyn Morro, MD         Earney NavyJosephine C Onuoha, NP 01/13/2021, 4:16 PM

## 2021-01-13 NOTE — [facility] Group Notes (Deleted)
Patient feels like a 7 out of 10 Wants to work on patience

## 2021-01-13 NOTE — [facility] Group Notes (Signed)
Patient did not attend group.

## 2021-01-13 NOTE — Progress Notes (Signed)
Pharmacy:  [facility]  Records found regarding prescription fill dates for Yevonne Pax are as follow from [facility] [address] [phone] 07/24/20 08/19/20 01/04/21 11/02/20 12/05/20  Peggye Fothergill, Ilda Basset D

## 2021-01-13 NOTE — Progress Notes (Signed)
D: Patient is alert and oriented. Patient rates depression 0/10, hopelessness 0/10, and anxiety 0/10. Patient stated goal successfully being humble. Patient reports concentration as good. Patient reports slept fair last night. Patient did receive medication for sleep and did find it was effective. Denies physical pain. Denies SI,HI, or AVH at this time.   A: Scheduled medications administered to patient per MD orders. Reassurance, support and encouragement provided. Verbally contracts for safety. Routine unit safety checks conducted Q 15 minutes.   R: Patient adhered to medication administration. No adverse drug reactions noted. Interacts well with others in milieu. Remains safe at this time, will continue to monitor.    [place] NOVEL CORONAVIRUS (COVID-19) DAILY CHECK-OFF SYMPTOMS - answer yes or no to each - every day NO YES  Have you had a fever in the past 24 hours?  Fever (Temp > 37.80C / 100F) X    Have you had any of these symptoms in the past 24 hours? New Cough  Sore Throat   Shortness of Breath  Difficulty Breathing  Unexplained Body Aches   X    Have you had any one of these symptoms in the past 24 hours not related to allergies?   Runny Nose  Nasal Congestion  Sneezing   X    If you have had runny nose, nasal congestion, sneezing in the past 24 hours, has it worsened?   X    EXPOSURES - check yes or no X    Have you traveled outside the state in the past 14 days?   X    Have you been in contact with someone with a confirmed diagnosis of COVID-19 or PUI in the past 14 days without wearing appropriate PPE?   X    Have you been living in the same home as a person with confirmed diagnosis of COVID-19 or a PUI (household contact)?     X    Have you been diagnosed with COVID-19?     X                                                                                                                             What to do next: Answered NO to all: Answered YES to anything:     Proceed with unit schedule Follow the [facility] Inpatient Flowsheet.

## 2021-01-14 DIAGNOSIS — F203 Undifferentiated schizophrenia: Secondary | ICD-10-CM | POA: Diagnosis not present

## 2021-01-14 NOTE — Progress Notes (Signed)
   01/14/21 0046  Psych Admission Type (Psych Patients Only)  Admission Status Involuntary  Psychosocial Assessment  Patient Complaints Isolation  Eye Contact Brief  Facial Expression Anxious  Affect Anxious  Speech Slow  Interaction Assertive  Motor Activity Fidgety;Slow  Appearance/Hygiene Unremarkable  Behavior Characteristics Cooperative  Mood Pleasant  Thought Process  Coherency Concrete thinking  Content WDL  Delusions None reported or observed  Perception WDL  Hallucination None reported or observed  Judgment Poor  Confusion None  Danger to Self  Current suicidal ideation? Denies  Danger to Others  Danger to Others None reported or observed

## 2021-01-14 NOTE — Plan of Care (Signed)
  Problem: Safety: Goal: Periods of time without injury will increase Outcome: Progressing   Problem: Education: Goal: Knowledge of the prescribed therapeutic regimen will improve Outcome: Progressing   Problem: Coping: Goal: Coping ability will improve Outcome: Progressing

## 2021-01-14 NOTE — [facility] Group Notes (Signed)
Patient did not attend the Psycho-ed group.

## 2021-01-14 NOTE — [facility] Group Notes (Signed)
LCSW Group Therapy Notes    Type of Therapy and Topic: Group Therapy: Effective Communication   Participation Level: Active   Description of Group:  In this group patients will be asked to identify their own styles of communication as well as defining and identifying passive, assertive, and aggressive styles of communication. Participants will identify strategies to communicate in a more assertive manner in an effort to appropriately meet their needs. This group will be process-oriented, with patients participating in exploration of their own experiences as well as giving and receiving support and challenge from other group members.   Therapeutic Goals: 1. Patient will identify their personal communication style. 2. Patient will identify passive, assertive, and aggressive forms of communication. 3. Patient will identify strategies for developing more effective communication to appropriately meet their needs.      Summary of Patient Progress:   This group has been supplemented with worksheets. Patient was given opportunity to meet with CSW one on one to review worksheets.     Therapeutic Modalities:  Communication Skills Solution Focused Therapy Motivational Interviewing     Yafet Cline MSW, LCSW Clincal Social Worker  [facility] 

## 2021-01-14 NOTE — Progress Notes (Addendum)
[facility] MD Progress Note  01/14/2021 3:48 PM JEHU MCCAUSLIN  MRN:  [id number] Subjective:  " My dad wanted me to come here and get help" Reason for admission:  Mario Lloyd is a 20 year old male with history of schizophrenia, depression, anxiety and substance use (THC) who presented accompanied by his parents as a walk-in to [facility] on 01/10/2021 due to symptoms of increased irritability, agitation, auditory and visual hallucinations, paranoia and feeling he is being watched and may be in danger. Principal Problem: Schizophrenia, undifferentiated (HCC) Diagnosis: Principal Problem:   Schizophrenia, undifferentiated (HCC) Active Problems:   Moderate cannabis use disorder (HCC) Objective:  Notes reviewed, Report obtained and care reviewed with members of our interdisciplinary team.  Patient was seen today for daily evaluation sitting with peers.  He was taken to his room for assessment.  He is much awake and alert than yesterday.  He slept 7.75 hours last night.  Today he is out with peers for basket ball and refreshment.  He is still avoiding most group activities.  He denied AVH and no SI/HI as well.  He reported that his father was very angry because he missed his medications that he wanted to kill him.  Provider tried to explain to patient that his father was not going to kill him but wanted him to understand the need to take medications.  Patient remains ambivalent regarding taking his medications.  Per SW note and collateral information from patient's father, he had ACT team in the past and to re enroll again.  Father also want patient in a group home.  He will get his Abilify Maintena 01/17/2021 before discharge.  Patient was educated again on the need to remain on Medications.  Plan is to consider discharge after his LAI is administered.  SW/CM will assist with appropriate disposition. Blood pressure 129/78, pulse 70, temperature 98 F (36.7 C), resp. rate 16, height 5\' 7"  (1.702 m), weight 73.5 kg,  SpO2 100 %. Total Time spent with patient:  25 minutes  Past Psychiatric History: See H&P note  Past Medical History:  Past Medical History:  Diagnosis Date   Anxiety    Asthma    Depression    Eczema    Eczema    Psychosis (HCC) 06/07/2015   Substance induced mood disorder (HCC) 06/14/2015   History reviewed. No pertinent surgical history. Family History: History reviewed. No pertinent family history. Family Psychiatric  History: see H&P Note Social History:  Social History   Substance and Sexual Activity  Alcohol Use No     Social History   Substance and Sexual Activity  Drug Use Yes   Types: Marijuana   Comment: UDS was negative; Pt + Benzos    Social History   Socioeconomic History   Marital status: Single    Spouse name: Not on file   Number of children: Not on file   Years of education: Not on file   Highest education level: Not on file  Occupational History   Not on file  Tobacco Use   Smoking status: Never    Passive exposure: Never   Smokeless tobacco: Never  Vaping Use   Vaping Use: Never used  Substance and Sexual Activity   Alcohol use: No   Drug use: Yes    Types: Marijuana    Comment: UDS was negative; Pt + Benzos   Sexual activity: Never  Other Topics Concern   Not on file  Social History Narrative   Not on file  Social Determinants of Health   Financial Resource Strain: Not on file  Food Insecurity: Not on file  Transportation Needs: Not on file  Physical Activity: Not on file  Stress: Not on file  Social Connections: Not on file   Additional Social History:    Sleep: Good  Appetite:  Good  Current Medications: Current Facility-Administered Medications  Medication Dose Route Frequency Provider Last Rate Last Admin   acetaminophen (TYLENOL) tablet 650 mg  650 mg Oral Q6H PRN Novella Olive, NP       alum & mag hydroxide-simeth (MAALOX/MYLANTA) 200-200-20 MG/5ML suspension 30 mL  30 mL Oral Q4H PRN Novella Olive, NP        [START ON 01/17/2021] ARIPiprazole ER (ABILIFY MAINTENA) injection 400 mg  400 mg Intramuscular Q28 days Claudie Revering, MD       benztropine (COGENTIN) tablet 0.5 mg  0.5 mg Oral Q6H PRN Claudie Revering, MD       diphenhydrAMINE (BENADRYL) injection 50 mg  50 mg Intramuscular Q6H PRN Claudie Revering, MD       divalproex (DEPAKOTE) DR tablet 250 mg  250 mg Oral Q12H Novella Olive, NP   250 mg at 01/14/21 0756   haloperidol (HALDOL) tablet 5 mg  5 mg Oral Q1200 Claudie Revering, MD   5 mg at 01/14/21 0756   hydrOXYzine (ATARAX/VISTARIL) tablet 25 mg  25 mg Oral TID PRN Novella Olive, NP   25 mg at 01/13/21 2143   OLANZapine zydis (ZYPREXA) disintegrating tablet 10 mg  10 mg Oral Q8H PRN Claudie Revering, MD       And   LORazepam (ATIVAN) tablet 1 mg  1 mg Oral PRN Claudie Revering, MD       And   ziprasidone (GEODON) injection 20 mg  20 mg Intramuscular PRN Claudie Revering, MD       magnesium hydroxide (MILK OF MAGNESIA) suspension 30 mL  30 mL Oral Daily PRN Novella Olive, NP       traZODone (DESYREL) tablet 50 mg  50 mg Oral QHS PRN Novella Olive, NP   50 mg at 01/13/21 2143    Lab Results:  No results found for this or any previous visit (from the past 48 hour(s)).   Blood Alcohol level:  Lab Results  Component Value Date   ETH <10 01/10/2021   ETH <10 01/10/2021    Metabolic Disorder Labs: Lab Results  Component Value Date   HGBA1C 5.4 01/10/2021   MPG 108.28 01/10/2021   MPG 105 08/14/2016   Lab Results  Component Value Date   PROLACTIN 0.3 (L) 01/10/2021   PROLACTIN 8.1 08/14/2016   Lab Results  Component Value Date   CHOL 131 01/12/2021   TRIG 47 01/12/2021   HDL 40 (L) 01/12/2021   CHOLHDL 3.3 01/12/2021   VLDL 9 01/12/2021   LDLCALC 82 01/12/2021   LDLCALC 61 08/14/2016    Physical Findings: AIMS: Facial and Oral Movements Muscles of Facial Expression: None, normal Lips and Perioral Area: None, normal Jaw: None, normal Tongue: None, normal,Extremity  Movements Upper (arms, wrists, hands, fingers): None, normal Lower (legs, knees, ankles, toes): None, normal, Trunk Movements Neck, shoulders, hips: None, normal, Overall Severity Severity of abnormal movements (highest score from questions above): None, normal Incapacitation due to abnormal movements: None, normal Patient's awareness of abnormal movements (rate only patient's report): No Awareness, Dental Status Current problems with teeth and/or dentures?: No Does patient  usually wear dentures?: No  CIWA:    COWS:     Musculoskeletal: Strength & Muscle Tone: within normal limits Gait & Station: normal Patient leans: Front  Psychiatric Specialty Exam:  Presentation  General Appearance: Casual; Neat  Eye Contact:Good  Speech:Normal Rate  Speech Volume:Normal  Handedness:Right   Mood and Affect  Mood:Angry  Affect:Constricted   Thought Process  Thought Processes:Coherent; Goal Directed  Descriptions of Associations:Intact  Orientation:Full (Time, Place and Person)  Thought Content:Logical  History of Schizophrenia/Schizoaffective disorder:Yes  Duration of Psychotic Symptoms:Greater than six months  Hallucinations:Hallucinations: None  Ideas of Reference:None  Suicidal Thoughts:Suicidal Thoughts: No  Homicidal Thoughts:Homicidal Thoughts: No   Sensorium  Memory:Immediate Fair; Recent Fair; Remote Fair  Judgment:Fair  Insight:Fair   Executive Functions  Concentration:Fair  Attention Span:Fair  Recall:Fair  Fund of Knowledge:Fair  Language:Good   Psychomotor Activity  Psychomotor Activity:Psychomotor Activity: Normal   Assets  Assets:Communication Skills; Physical Health   Sleep  Sleep:Sleep: Good Number of Hours of Sleep: 7.75    Physical Exam: Physical Exam Vitals and nursing note reviewed.  Constitutional:      Appearance: Normal appearance.  HENT:     Head: Normocephalic and atraumatic.     Nose: Nose normal.   Cardiovascular:     Rate and Rhythm: Bradycardia present.  Pulmonary:     Effort: Pulmonary effort is normal.  Musculoskeletal:        General: Normal range of motion.  Skin:    General: Skin is warm and dry.  Neurological:     General: No focal deficit present.     Mental Status: He is alert and oriented to person, place, and time.   Review of Systems  Constitutional: Negative.   HENT: Negative.    Eyes: Negative.   Respiratory: Negative.    Cardiovascular: Negative.   Gastrointestinal: Negative.   Genitourinary: Negative.   Musculoskeletal: Negative.   Skin: Negative.   Blood pressure 129/78, pulse 70, temperature 98 F (36.7 C), resp. rate 16, height 5\' 7"  (1.702 m), weight 73.5 kg, SpO2 100 %. Body mass index is 25.37 kg/m.   Treatment Plan Summary: Daily contact with patient to assess and evaluate symptoms and progress in treatment and Medication management Continue Haldol 5 mg po daily for psychosis and mental health Continue Depakote 250 mg DR tablet po bid for mood stabilization. Offer Abilify Maintena 400 mg  IM 01/17/2021 LAI Offer Benztropine 0.5 mg po q 6 hrs as needed for Medication induced EPS. Offer Benadryl injection 50 mg IM q 6 hrs for Dystonic reaction. Offer Trazodone 50 mg po at bed time as needed for sleep  01/19/2021, NP-PMHNP-BC 01/13/2021, 4:16 PM   I have reviewed the note by NP, and discussed the plan of care.  I am in agreement with the assessment and plan.  01/15/2021, NP         Earney Navy, NP 01/14/2021, 3:48 PM

## 2021-01-14 NOTE — [facility] Group Notes (Signed)
Patient did not attend the relaxation group. 

## 2021-01-14 NOTE — [facility] Group Notes (Signed)
[facility] Group Notes:  (Nursing/MHT/Case Management/Adjunct)  Date:  01/14/2021  Time:  10:20 AM  Type of Therapy:  Group Therapy  Participation Level:  Did Not Attend  Summary of Progress/Problems:  Patient did not attend goals group.                               Mario [name] 01/14/2021, 10:20 AM

## 2021-01-14 NOTE — Progress Notes (Signed)
Progress note    01/14/21 0756  Psych Admission Type (Psych Patients Only)  Admission Status Involuntary  Psychosocial Assessment  Patient Complaints Anxiety  Eye Contact Brief  Facial Expression Anxious;Pensive  Affect Anxious;Appropriate to circumstance  Speech Logical/coherent  Interaction Assertive  Motor Activity Fidgety  Appearance/Hygiene Unremarkable  Behavior Characteristics Cooperative;Appropriate to situation;Anxious  Mood Anxious;Pleasant  Thought Process  Coherency Concrete thinking  Content WDL  Delusions WDL  Perception WDL  Hallucination None reported or observed  Judgment Poor  Confusion None  Danger to Self  Current suicidal ideation? Denies  Danger to Others  Danger to Others None reported or observed

## 2021-01-14 NOTE — [facility] Suicide Risk Assessment (Signed)
[facility] INPATIENT:  Family/Significant Other Suicide Prevention Education  Suicide Prevention Education:  Education Completed; father, [place] [phone]),  has been identified by the patient as the family member/significant other with whom the patient will be residing, and identified as the person(s) who will aid the patient in the event of a mental health crisis (suicidal ideations/suicide attempt).  With written consent from the patient, the family member/significant other has been provided the following suicide prevention education, prior to the and/or following the discharge of the patient.   CSW spoke with this pt's father who shared that this pt used to have an ACT Team. He states that this pt feels that he has a handle on everything on his own, but feels the ACTT and group home placement would be beneficial for this pt. He states that this pt does not have any income, however they have been applying for disability income.    The suicide prevention education provided includes the following: Suicide risk factors Suicide prevention and interventions National Suicide Hotline telephone number [facility] assessment telephone number [facility] Emergency Assistance 911 [facility] and/or Residential Mobile Crisis Unit telephone number  Request made of family/significant other to: Remove weapons (e.g., guns, rifles, knives), all items previously/currently identified as safety concern.   Remove drugs/medications (over-the-counter, prescriptions, illicit drugs), all items previously/currently identified as a safety concern.  The family member/significant other verbalizes understanding of the suicide prevention education information provided.  The family member/significant other agrees to remove the items of safety concern listed above.  Malachy Chamber Dawanda Mapel 01/14/2021, 12:57 PM

## 2021-01-15 DIAGNOSIS — F203 Undifferentiated schizophrenia: Secondary | ICD-10-CM | POA: Diagnosis not present

## 2021-01-15 MED ORDER — HALOPERIDOL 5 MG PO TABS
5.0000 mg | ORAL_TABLET | Freq: Every evening | ORAL | Status: DC
  Administered 2021-01-15: 5 mg via ORAL
  Filled 2021-01-15 (×2): qty 1

## 2021-01-15 MED ORDER — ENSURE ENLIVE PO LIQD
237.0000 mL | Freq: Two times a day (BID) | ORAL | Status: DC
  Administered 2021-01-16 – 2021-01-17 (×3): 237 mL via ORAL
  Filled 2021-01-15 (×7): qty 237

## 2021-01-15 MED ORDER — LORAZEPAM 2 MG/ML IJ SOLN: 2.0000 mg | Freq: Four times a day (QID) | INTRAMUSCULAR | Status: DC | PRN

## 2021-01-15 MED ORDER — LORAZEPAM 1 MG PO TABS: 1.0000 mg | ORAL_TABLET | Freq: Four times a day (QID) | ORAL | Status: DC | PRN

## 2021-01-15 NOTE — Progress Notes (Signed)
Adult Psychoeducational Group Note  Date:  01/15/2021 Time:  8:38 PM  Group Topic/Focus:  Wrap-Up Group:   The focus of this group is to help patients review their daily goal of treatment and discuss progress on daily workbooks.  Participation Level:  Did Not Attend  Participation Quality:   Not Applicable  Affect:   Not Applicable  Cognitive:   Not Applicable  Insight: None  Engagement in Group:   Not Applicable  Modes of Intervention:   Not Applicable  Additional Comments:    Nicoletta Dress 01/15/2021, 8:38 PM

## 2021-01-15 NOTE — [facility] Group Notes (Signed)
CSW was unable to hold group today due to late discharges and acuity of the unit.   Adler Chartrand, LCSWA Clinicial Social Worker [facility] 

## 2021-01-15 NOTE — Progress Notes (Signed)
   01/14/21 2340  Psych Admission Type (Psych Patients Only)  Admission Status Involuntary  Psychosocial Assessment  Patient Complaints None  Eye Contact Brief  Facial Expression Anxious;Pensive  Affect Anxious;Appropriate to circumstance  Speech Logical/coherent  Interaction Assertive  Motor Activity Fidgety  Appearance/Hygiene Unremarkable  Behavior Characteristics Cooperative  Mood Pleasant  Thought Process  Coherency Concrete thinking  Content WDL  Delusions WDL  Perception WDL  Hallucination None reported or observed  Judgment Poor  Confusion None  Danger to Self  Current suicidal ideation? Denies  Danger to Others  Danger to Others None reported or observed

## 2021-01-15 NOTE — [facility] Group Notes (Signed)
Patient did not attend group.    Spiritual care group on grief and loss facilitated by chaplain Katy Macey Wurtz, [facility]   Group Goal:   Support / Education around grief and loss   Members engage in facilitated group support and psycho-social education.   Group Description:   Following introductions and group rules, group members engaged in facilitated group dialog and support around topic of loss, with particular support around experiences of loss in their lives. Group Identified types of loss (relationships / self / things) and identified patterns, circumstances, and changes that precipitate losses. Reflected on thoughts / feelings around loss, normalized grief responses, and recognized variety in grief experience. Group noted Worden's four tasks of grief in discussion.   Group drew on Adlerian / Rogerian, narrative, MI,    

## 2021-01-15 NOTE — Progress Notes (Signed)
Pt did not participate in group °

## 2021-01-15 NOTE — Progress Notes (Signed)
   01/15/21 0628  Vital Signs  Temp (!) 97.5 F (36.4 C)  Temp Source Oral  Pulse Rate (!) 54  Pulse Rate Source Monitor  Resp 18  BP 127/73  BP Location Left Arm  BP Method Automatic  Patient Position (if appropriate) Sitting  Oxygen Therapy  SpO2 100 %   D: Patient denies SI/HI/AVH. Pt. Denies Anxiety and depression. Pt. Asked for ensure shakes A:  Patient took scheduled medicine.  Support and encouragement provided Routine safety checks conducted every 15 minutes. Patient  Informed to notify staff with any concerns.   R:  Safety maintained.

## 2021-01-15 NOTE — Progress Notes (Signed)
D:  Tarence has been isolative to his room.  He did come out for medications and snack.  He did not attend evening NA group.  He denied SI/HI or AVH.  Minimal with staff and brief answering questions.  He voiced no questions or issues at this time.  No PRN's given at this time.  He is currently resting with his eyes closed and appears to be asleep. A:  1:1 with RN for support and encouragement.  Medications as ordered.  Q 15 minute checks maintained for safety.  Encouraged participation in group and unit activities. R:  He remains safe on the unit.  We will continue to monitor the progress towards his goals.    01/15/21 2156  Psych Admission Type (Psych Patients Only)  Admission Status Involuntary  Psychosocial Assessment  Patient Complaints Anergic  Eye Contact Brief  Facial Expression Anxious;Pensive  Affect Anxious;Appropriate to circumstance  Speech Logical/coherent  Interaction Forwards little  Motor Activity Slow  Appearance/Hygiene Unremarkable  Behavior Characteristics Cooperative;Appropriate to situation  Mood Depressed  Thought Process  Coherency Concrete thinking  Content WDL  Delusions WDL  Perception WDL  Hallucination None reported or observed  Judgment Poor  Confusion None  Danger to Self  Current suicidal ideation? Denies  Danger to Others  Danger to Others None reported or observed

## 2021-01-15 NOTE — Progress Notes (Signed)
Recreation Therapy Notes  Date: 8.29.22 Time: 0930 Location: [address] Dayroom  Group Topic: Stress Management   Goal Area(s) Addresses:  Patient will actively participate in stress management techniques presented during session.  Patient will successfully identify benefit of practicing stress management post d/c.   Intervention: Relaxation exercise with ambient sound and script   Activity: Guided Imagery. LRT provided education, instruction, and demonstration on practice of visualization via guided imagery. Patient was asked to participate in the technique introduced during session. Patients were given suggestions of ways to access scripts post d/c and encouraged to explore Youtube and other apps available on smartphones, tablets, and computers.  Education:  Stress Management, Discharge Planning.   Education Outcome: Acknowledges education  Clinical Observations/Feedback: Patient did not attend group session.   Caroll Rancher, LRT/CTRS         Lillia Abed, Kasper Mudrick A 01/15/2021 11:01 AM

## 2021-01-15 NOTE — Progress Notes (Signed)
[facility] MD Progress Note  01/15/2021 5:32 PM Mario Lloyd  MRN:  [id number]  Reason for admission:  Mario Lloyd is a 20 year old male with history of schizophrenia, depression, anxiety and substance use (THC) who presented accompanied by his parents as a walk-in to [facility] on 01/10/2021 due to symptoms of increased irritability, agitation, auditory and visual hallucinations, paranoia and feeling he is being watched and may be in danger. Patient's parents reported that patient made statements to others that he felt his father was going to kill him.   Objective: Medical record reviewed.  Patient's case discussed in detail with members of the treatment team and nursing staff.  I met with and evaluated the patient on the unit today for follow-up.  Patient reports that he feels better.  He presents with constricted affect and coherent but vague thought processes.  Patient appears to be minimizing symptoms.  He denies any current symptoms including depressed mood, anxiety, AH, VH, PI, AI, HI or SI.  Patient denies ever endorsing hallucinations or paranoia to me or the ED staff on admission.  He denies medication side effects or physical problems.  Patient reports that he has been eating and sleeping well.  He states that he is looking forward to discharge so that he can get a job.  Patient is uncertain as to where he will live after discharge.  He denies paranoid concerns about his father and states that he never had concerns that his father was trying to harm him prior to admission.  Staff document the patient slept 6.25 hours.  Collateral information: I made direct phone contact today with patient's father Mario Lloyd with patient's permission.  Mr. Mario Lloyd stated that Mario Lloyd historically has been reluctant to take oral medications.  He states that [place] went 15 to 16 days beyond the date that his most recent Abilify Maintena injection was due.  Father believes patient's most recent Abilify Maintena injection  was administered on 12/25/2020 and patient has an appointment to receive his next injection on 01/17/2021.  Father reports that prior to last injection patient was paranoid (thought father was trying to kill him), staring, talking to himself for long periods of time in response to internal stimuli, not sleeping, not bathing and not tending to self-care.  Father reports that patient has attempted to obtain work over recent months.  Patient has been able to obtain 6 or 7 jobs but only lasts about a day at each job due to patient's difficulty processing information and performing the work required.  Father reports that patient has had prior antipsychotic trials of Zyprexa and Abilify.  Zyprexa was discontinued so that patient could be on long-acting injectable medication and Abilify Maintena was initiated.  Patient's father reports that patient seems to have symptoms even when he receives his monthly Abilify Maintena injections.  Reportedly at his best in recent years, patient will continue to ask odd questions, has poor insight and has difficult time holding a job.  Father denies any prior known trials of Haldol or Risperdal.  Father is working on applying for disability for patient and is interested in patient being able to live in a group home after discharge.    Principal Problem: Schizophrenia, undifferentiated ([place]) Diagnosis: Principal Problem:   Schizophrenia, undifferentiated ([place]) Active Problems:   Moderate cannabis use disorder (HCC)  Total Time spent with patient: 30 minutes  Past Psychiatric History: See admission H&P  Past Medical History:  Past Medical History:  Diagnosis Date   Anxiety  Asthma    Depression    Eczema    Eczema    Psychosis ([place]) 06/07/2015   Substance induced mood disorder ([place]) 06/14/2015   History reviewed. No pertinent surgical history. Family History: History reviewed. No pertinent family history. Family Psychiatric  History: See admission H&P Social  History:  Social History   Substance and Sexual Activity  Alcohol Use No     Social History   Substance and Sexual Activity  Drug Use Yes   Types: Marijuana   Comment: UDS was negative; Pt + Benzos    Social History   Socioeconomic History   Marital status: Single    Spouse name: Not on file   Number of children: Not on file   Years of education: Not on file   Highest education level: Not on file  Occupational History   Not on file  Tobacco Use   Smoking status: Never    Passive exposure: Never   Smokeless tobacco: Never  Vaping Use   Vaping Use: Never used  Substance and Sexual Activity   Alcohol use: No   Drug use: Yes    Types: Marijuana    Comment: UDS was negative; Pt + Benzos   Sexual activity: Never  Other Topics Concern   Not on file  Social History Narrative   Not on file   Social Determinants of Health   Financial Resource Strain: Not on file  Food Insecurity: Not on file  Transportation Needs: Not on file  Physical Activity: Not on file  Stress: Not on file  Social Connections: Not on file   Additional Social History:                         Sleep: Good  Appetite:  Good  Current Medications: Current Facility-Administered Medications  Medication Dose Route Frequency Provider Last Rate Last Admin   acetaminophen (TYLENOL) tablet 650 mg  650 mg Oral Q6H PRN Chalmers Guest, NP       alum & mag hydroxide-simeth (MAALOX/MYLANTA) 200-200-20 MG/5ML suspension 30 mL  30 mL Oral Q4H PRN Chalmers Guest, NP       [START ON 01/17/2021] ARIPiprazole ER (ABILIFY MAINTENA) injection 400 mg  400 mg Intramuscular Q28 days Arthor Captain, MD       benztropine (COGENTIN) tablet 0.5 mg  0.5 mg Oral Q6H PRN Arthor Captain, MD       diphenhydrAMINE (BENADRYL) injection 50 mg  50 mg Intramuscular Q6H PRN Arthor Captain, MD       divalproex (DEPAKOTE) DR tablet 250 mg  250 mg Oral Q12H Chalmers Guest, NP   250 mg at 01/15/21 0837   [START ON 01/16/2021]  feeding supplement (ENSURE ENLIVE / ENSURE PLUS) liquid 237 mL  237 mL Oral BID BM Ethelene Hal, NP       haloperidol (HALDOL) tablet 5 mg  5 mg Oral Q1200 Arthor Captain, MD   5 mg at 01/15/21 7654   hydrOXYzine (ATARAX/VISTARIL) tablet 25 mg  25 mg Oral TID PRN Chalmers Guest, NP   25 mg at 01/14/21 2140   OLANZapine zydis (ZYPREXA) disintegrating tablet 10 mg  10 mg Oral Q8H PRN Arthor Captain, MD       And   LORazepam (ATIVAN) tablet 1 mg  1 mg Oral PRN Arthor Captain, MD       And   ziprasidone (GEODON) injection 20 mg  20 mg Intramuscular  PRN Arthor Captain, MD       magnesium hydroxide (MILK OF MAGNESIA) suspension 30 mL  30 mL Oral Daily PRN Chalmers Guest, NP       traZODone (DESYREL) tablet 50 mg  50 mg Oral QHS PRN Chalmers Guest, NP   50 mg at 01/14/21 2140    Lab Results: No results found for this or any previous visit (from the past 48 hour(s)).  Blood Alcohol level:  Lab Results  Component Value Date   ETH <10 01/10/2021   ETH <10 70/14/1030    Metabolic Disorder Labs: Lab Results  Component Value Date   HGBA1C 5.4 01/10/2021   MPG 108.28 01/10/2021   MPG 105 08/14/2016   Lab Results  Component Value Date   PROLACTIN 0.3 (L) 01/10/2021   PROLACTIN 8.1 08/14/2016   Lab Results  Component Value Date   CHOL 131 01/12/2021   TRIG 47 01/12/2021   HDL 40 (L) 01/12/2021   CHOLHDL 3.3 01/12/2021   VLDL 9 01/12/2021   LDLCALC 82 01/12/2021   LDLCALC 61 08/14/2016    Physical Findings: AIMS: Facial and Oral Movements Muscles of Facial Expression: None, normal Lips and Perioral Area: None, normal Jaw: None, normal Tongue: None, normal,Extremity Movements Upper (arms, wrists, hands, fingers): None, normal Lower (legs, knees, ankles, toes): None, normal, Trunk Movements Neck, shoulders, hips: None, normal, Overall Severity Severity of abnormal movements (highest score from questions above): None, normal Incapacitation due to abnormal movements:  None, normal Patient's awareness of abnormal movements (rate only patient's report): No Awareness, Dental Status Current problems with teeth and/or dentures?: No Does patient usually wear dentures?: No  CIWA:    COWS:     Musculoskeletal: Strength & Muscle Tone: within normal limits Gait & Station: normal Patient leans: N/A  Psychiatric Specialty Exam:  Presentation  General Appearance: Fairly Groomed  Eye Contact:Fair  Speech:Clear and Coherent  Speech Volume:Normal  Handedness:Right   Mood and Affect  Mood:Irritable  Affect:Constricted   Thought Process  Thought Processes:Coherent (Vague)  Descriptions of Associations:Circumstantial  Orientation:Full (Time, Place and Person)  Thought Content:Other (comment) (Paucity of thought content offered/elicited)  History of Schizophrenia/Schizoaffective disorder:Yes  Duration of Psychotic Symptoms:Greater than six months  Hallucinations:Hallucinations: Other (comment) (Patient denies hallucinations)  Ideas of Reference:None  Suicidal Thoughts:Suicidal Thoughts: No  Homicidal Thoughts:Homicidal Thoughts: No   Sensorium  Memory:Immediate Fair; Recent Fair  Judgment:Fair  Insight:Shallow   Executive Functions  Concentration:Fair  Attention Span:Fair  [place]  Language:Good   Psychomotor Activity  Psychomotor Activity:Psychomotor Activity: Normal   Assets  Assets:Communication Skills; Social Support; Physical Health   Sleep  Sleep:Sleep: Good Number of Hours of Sleep: 6.25    Physical Exam: Physical Exam Vitals and nursing note reviewed.  Constitutional:      General: He is not in acute distress.    Appearance: Normal appearance. He is not diaphoretic.  HENT:     Head: Normocephalic and atraumatic.  Cardiovascular:     Rate and Rhythm: Bradycardia present.  Pulmonary:     Effort: Pulmonary effort is normal.  Neurological:     General: No focal deficit  present.     Mental Status: He is alert and oriented to person, place, and time.   Review of Systems  Constitutional:  Negative for diaphoresis and fever.  HENT:  Negative for sore throat.   Respiratory:  Negative for cough and shortness of breath.   Cardiovascular:  Negative for chest pain and palpitations.  Gastrointestinal:  Negative for constipation, diarrhea, nausea and vomiting.  Musculoskeletal:  Negative for myalgias.  Skin:  Negative for rash.  Neurological:  Negative for dizziness, tremors and headaches.  Psychiatric/Behavioral:  Negative for depression, hallucinations and suicidal ideas. The patient is nervous/anxious. The patient does not have insomnia.   Blood pressure 123/78, pulse (!) 59, temperature (!) 97.5 F (36.4 C), temperature source Oral, resp. rate 18, height 5' 7"  (1.702 m), weight 73.5 kg, SpO2 100 %. Body mass index is 25.37 kg/m.   Treatment Plan Summary:  The patient is a 20 year old male with schizophrenia undifferentiated type and cannabis use disorder admitted with worsening psychotic symptoms in the context of incomplete medication adherence and daily cannabis use.  Daily contact with patient to assess and evaluate symptoms and progress in treatment and Medication management  Continue IVC status  Schizophrenia, undifferentiated type -Increase Haldol to 5 mg PO twice daily -Goal is for eventual transition to Haldol Decanoate IM q. 28 days if patient has good response to treatment with oral Haldol. -Patient last received Abilify Maintena 400 mg q. 28 days approximately 12/25/2020.  Next injection reportedly was due on 01/17/2021.  Will defer administration of Abilify Maintena LAI for now and undertake different antipsychotic trial that can be given in monthly LAI form. -Continue Depakote DR 250 mg every 12 hours for mood stabilization -Continue benztropine 0.5 mg every 6 hours as needed for tremors, EPS -Continue diphenhydramine 50 mg IM every 6 hours as  needed for acute dystonic reaction  Anxiety -Continue hydroxyzine 25 mg 3 times daily as needed for anxiety  Agitation -Start lorazepam 1 mg every 6 hours as needed for agitation, restlessness -Start lorazepam 2 mg IM every 6 hours as needed for severe agitation where patient is unable to take oral PRN medications.  Insomnia -Continue trazodone 50 mg nightly as needed for insomnia  Discharge planning in progress    Arthor Captain, MD 01/15/2021, 5:32 PM

## 2021-01-15 NOTE — Group Note (Signed)
Occupational Therapy Group Note  Group Topic:Feelings Management  Group Date: 01/15/2021 Start Time: 1400 End Time: 1450 Facilitators: Nicola Quesnell, OT   Group Description:  Group encouraged increased engagement and participation through discussion focused on Building Happiness. Patients were provided a handout and reviewed therapeutic strategies to build happiness including identifying gratitudes, random acts of kindness, exercise, meditation, positive journaling, and fostering relationships. Patients engaged in discussion and encouraged to reflect on each strategy and their experiences.  Therapeutic Goal(s): Identify strategies to build happiness. Identify and implement therapeutic strategies to improve overall mood. Practice and identify gratitudes, random acts of kindness, exercise, meditation, positive journaling, and fostering relationships  Participation Level: Patient did not attend OT group session despite personal invitation.    Plan: Continue to engage patient in OT groups 2 - 3x/week.  01/15/2021  Marlene Beidler, OT 

## 2021-01-16 DIAGNOSIS — F203 Undifferentiated schizophrenia: Secondary | ICD-10-CM | POA: Diagnosis not present

## 2021-01-16 MED ORDER — ARIPIPRAZOLE ER 400 MG IM SRER
400.0000 mg | INTRAMUSCULAR | Status: DC
  Administered 2021-01-17: 400 mg via INTRAMUSCULAR

## 2021-01-16 MED ORDER — RISPERIDONE 2 MG PO TBDP
2.0000 mg | ORAL_TABLET | Freq: Every evening | ORAL | Status: DC
  Filled 2021-01-16: qty 1

## 2021-01-16 MED ORDER — HALOPERIDOL 5 MG PO TABS
5.0000 mg | ORAL_TABLET | Freq: Two times a day (BID) | ORAL | Status: DC
  Administered 2021-01-16 – 2021-01-17 (×2): 5 mg via ORAL
  Filled 2021-01-16 (×6): qty 1

## 2021-01-16 NOTE — BHH Counselor (Signed)
CSW spoke with pt's father who states that he plans to apply for emergent guardianship with the [employer] tomorrow 01/17/2021 first thing in the morning.      Ruthann Cancer MSW, LCSW Clincal Social Worker  [facility]

## 2021-01-16 NOTE — Progress Notes (Signed)
The focus of this group is to help patients review their daily goal of treatment and discuss progress on daily workbooks. Pt attended the evening group session and responded to all discussion prompts from the Writer. Pt shared that today was a good day on the unit, the highlight of which was enjoying good food in the cafeteria.  During wrap-up, Pts discussed healthy ways to cope with anxiety. Shaw mentioned creating music as a way to cope with his anxiety.  Pt rated his day a 9 out of 10 and his affect was appropriate.

## 2021-01-16 NOTE — Progress Notes (Signed)
Recreation Therapy Notes  Animal-Assisted Activity (AAA) Program Checklist/Progress Notes Patient Eligibility Criteria Checklist & Daily Group note for Rec Tx Intervention  Date: 8.30.22 Time: 1430 Location: [address]  AAA/T Program Assumption of Risk Form signed by Engineer, production or Parent Legal Guardian  YES   Patient is free of allergies or severe asthma  YES   Patient reports no fear of animals YES  Patient reports no history of cruelty to animals YES  Patient understands his/her participation is voluntary YES   Patient washes hands before animal contact YES   Patient washes hands after animal contact YES   Education: Charity fundraiser, Appropriate Animal Interaction   Education Outcome: Acknowledges understanding/In group clarification offered/Needs additional education.   Clinical Observations/Feedback: Pt did not attend group session.     Caroll Rancher, LRT/CTRS     Caroll Rancher A 01/16/2021 3:54 PM

## 2021-01-16 NOTE — Plan of Care (Signed)
  Problem: Coping: Goal: Ability to identify and develop effective coping behavior will improve Outcome: Progressing   Problem: Self-Concept: Goal: Level of anxiety will decrease Outcome: Progressing   Problem: Education: Goal: Knowledge of [place] [facility] Education information/materials will improve Outcome: Progressing

## 2021-01-16 NOTE — Progress Notes (Signed)
[facility] MD Progress Note  01/16/2021 3:49 PM SISTO GRANILLO  MRN:  [id number]  Reason for admission:  Mario Lloyd. Calvin is a 20 year old male with history of schizophrenia, depression, anxiety and substance use (THC) who presented accompanied by his parents as a walk-in to [facility] on 01/10/2021 due to symptoms of increased irritability, agitation, auditory and visual hallucinations, paranoia and feeling he is being watched and may be in danger. Patient's parents reported that patient made statements to others that he felt his father was going to kill him.   Objective: Medical record reviewed.  Patient's case discussed in detail with members of the treatment team and nursing staff.  I met with and evaluated the patient on the unit today for follow-up.  Patient looks better today.  Eye contact and engagement in conversation with me are improved.  Speech is spontaneous and thought processes are coherent and much more organized.  Patient offers greater thought content and provides more descriptive responses to questions.  He denies perceptual abnormalities and does not appear to attend or respond to internal stimuli during our conversation.  Patient describes his mood as "fine."  He denies sad or depressed mood, anhedonia, anxiety, significant worry, passive wish for death, SI, AI, HI, PI, AH or VH.  I advised patient to undergo trial of Risperdal for psychotic symptoms with plan to transition to long-acting injectable risperidone or paliperidone instead of Abilify Maintena.  I discussed that a different medication may help his symptoms more and make it easier for him to get and keep a job.  Patient declines trial of oral Risperdal or long-acting injectable risperidone or paliperidone.  Patient states his belief that he does not have any psychiatric illness.  I reminded him of his fear on admission that his father was trying to shoot him.  Patient states that he heard his father tell him this multiple times.  I attempted  to explain to patient that he may have heard those things or felt worried about his safety due to psychiatric symptoms.  Patient is insistent that he does not have a psychiatric condition.  I encouraged him to remain on monthly Abilify Maintena long-acting injectable antipsychotic if he is unwilling to try different medication.  Patient stated willingness to continue Abilify Maintena injections.  The plan is for him to receive his next injection tomorrow.  I also encouraged patient to continue on oral Haldol.  Patient stated that he would take the medication while in the hospital.  It is unclear whether he will take it after discharge.  I advised him that if he stops taking his psychiatric medications I believe it will be more difficult for him to get and keep a job.  Patient expressed disagreement with this opinion.  We discussed the risks of cannabis use.  I advised cessation of cannabis use.  Patient expressed ambivalence about ceasing use.  Patient denies medication side effects or any physical problems.  He reports that he is eating and sleeping well.  Staff document patient slept 6 hours last night.  No new labs today.  Vital signs this morning were stable and within normal limits and the patient is afebrile.  Patient is taking scheduled medications as prescribed.  He has not required any PRN medications in the last 24 hours.  Staff document the patient has not been attending groups and has generally been isolative to his room.   Principal Problem: Schizophrenia, undifferentiated ([place]) Diagnosis: Principal Problem:   Schizophrenia, undifferentiated ([place]) Active Problems:  Moderate cannabis use disorder (HCC)  Total Time spent with patient: 25 minutes  Past Psychiatric History: See admission H&P  Past Medical History:  Past Medical History:  Diagnosis Date   Anxiety    Asthma    Depression    Eczema    Eczema    Psychosis ([place]) 06/07/2015   Substance induced mood disorder ([place]) 06/14/2015    History reviewed. No pertinent surgical history. Family History: History reviewed. No pertinent family history. Family Psychiatric  History: See admission H&P Social History:  Social History   Substance and Sexual Activity  Alcohol Use No     Social History   Substance and Sexual Activity  Drug Use Yes   Types: Marijuana   Comment: UDS was negative; Pt + Benzos    Social History   Socioeconomic History   Marital status: Single    Spouse name: Not on file   Number of children: Not on file   Years of education: Not on file   Highest education level: Not on file  Occupational History   Not on file  Tobacco Use   Smoking status: Never    Passive exposure: Never   Smokeless tobacco: Never  Vaping Use   Vaping Use: Never used  Substance and Sexual Activity   Alcohol use: No   Drug use: Yes    Types: Marijuana    Comment: UDS was negative; Pt + Benzos   Sexual activity: Never  Other Topics Concern   Not on file  Social History Narrative   Not on file   Social Determinants of Health   Financial Resource Strain: Not on file  Food Insecurity: Not on file  Transportation Needs: Not on file  Physical Activity: Not on file  Stress: Not on file  Social Connections: Not on file   Additional Social History:                         Sleep: Good  Appetite:  Good  Current Medications: Current Facility-Administered Medications  Medication Dose Route Frequency Provider Last Rate Last Admin   acetaminophen (TYLENOL) tablet 650 mg  650 mg Oral Q6H PRN Chalmers Guest, NP       alum & mag hydroxide-simeth (MAALOX/MYLANTA) 200-200-20 MG/5ML suspension 30 mL  30 mL Oral Q4H PRN Chalmers Guest, NP       [START ON 01/17/2021] ARIPiprazole ER (ABILIFY MAINTENA) injection 400 mg  400 mg Intramuscular Q28 days Arthor Captain, MD       benztropine (COGENTIN) tablet 0.5 mg  0.5 mg Oral Q6H PRN Arthor Captain, MD       diphenhydrAMINE (BENADRYL) injection 50 mg  50 mg  Intramuscular Q6H PRN Arthor Captain, MD       divalproex (DEPAKOTE) DR tablet 250 mg  250 mg Oral Q12H Chalmers Guest, NP   250 mg at 01/16/21 0803   feeding supplement (ENSURE ENLIVE / ENSURE PLUS) liquid 237 mL  237 mL Oral BID BM Ethelene Hal, NP   237 mL at 01/16/21 0941   haloperidol (HALDOL) tablet 5 mg  5 mg Oral BID Arthor Captain, MD       hydrOXYzine (ATARAX/VISTARIL) tablet 25 mg  25 mg Oral TID PRN Chalmers Guest, NP   25 mg at 01/14/21 2140   LORazepam (ATIVAN) injection 2 mg  2 mg Intramuscular Q6H PRN Arthor Captain, MD       LORazepam (ATIVAN) tablet  1 mg  1 mg Oral Q6H PRN Arthor Captain, MD       magnesium hydroxide (MILK OF MAGNESIA) suspension 30 mL  30 mL Oral Daily PRN Chalmers Guest, NP       traZODone (DESYREL) tablet 50 mg  50 mg Oral QHS PRN Chalmers Guest, NP   50 mg at 01/14/21 2140    Lab Results: No results found for this or any previous visit (from the past 48 hour(s)).  Blood Alcohol level:  Lab Results  Component Value Date   ETH <10 01/10/2021   ETH <10 66/10/3014    Metabolic Disorder Labs: Lab Results  Component Value Date   HGBA1C 5.4 01/10/2021   MPG 108.28 01/10/2021   MPG 105 08/14/2016   Lab Results  Component Value Date   PROLACTIN 0.3 (L) 01/10/2021   PROLACTIN 8.1 08/14/2016   Lab Results  Component Value Date   CHOL 131 01/12/2021   TRIG 47 01/12/2021   HDL 40 (L) 01/12/2021   CHOLHDL 3.3 01/12/2021   VLDL 9 01/12/2021   LDLCALC 82 01/12/2021   LDLCALC 61 08/14/2016    Physical Findings: AIMS: Facial and Oral Movements Muscles of Facial Expression: None, normal Lips and Perioral Area: None, normal Jaw: None, normal Tongue: None, normal,Extremity Movements Upper (arms, wrists, hands, fingers): None, normal Lower (legs, knees, ankles, toes): None, normal, Trunk Movements Neck, shoulders, hips: None, normal, Overall Severity Severity of abnormal movements (highest score from questions above): None,  normal Incapacitation due to abnormal movements: None, normal Patient's awareness of abnormal movements (rate only patient's report): No Awareness, Dental Status Current problems with teeth and/or dentures?: No Does patient usually wear dentures?: No  CIWA:    COWS:     Musculoskeletal: Strength & Muscle Tone: within normal limits Gait & Station: normal Patient leans: N/A  Psychiatric Specialty Exam:  Presentation  General Appearance: Casual; Fairly Groomed  Eye Contact:Good  Speech:Clear and Coherent; Normal Rate  Speech Volume:Normal  Handedness:Right   Mood and Affect  Mood:Irritable  Affect:Congruent   Thought Process  Thought Processes:Coherent; Goal Directed  Descriptions of Associations:Intact  Orientation:Full (Time, Place and Person)  Thought Content:Logical  History of Schizophrenia/Schizoaffective disorder:Yes  Duration of Psychotic Symptoms:Greater than six months  Hallucinations:Hallucinations: None  Ideas of Reference:None  Suicidal Thoughts:Suicidal Thoughts: No  Homicidal Thoughts:Homicidal Thoughts: No   Sensorium  Memory:Immediate Fair; Recent Fair  Judgment:Fair  Insight:Lacking   Executive Functions  Concentration:Good  Attention Span:Good  [place]  Language:Good   Psychomotor Activity  Psychomotor Activity:Psychomotor Activity: Normal   Assets  Assets:Communication Skills; Social Support; Physical Health   Sleep  Sleep:Sleep: Good Number of Hours of Sleep: 6    Physical Exam: Physical Exam Vitals and nursing note reviewed.  Constitutional:      General: He is not in acute distress.    Appearance: Normal appearance. He is not diaphoretic.  HENT:     Head: Normocephalic and atraumatic.  Cardiovascular:     Rate and Rhythm: Normal rate.  Pulmonary:     Effort: Pulmonary effort is normal.  Neurological:     General: No focal deficit present.     Mental Status: He is  alert and oriented to person, place, and time.   Review of Systems  Constitutional:  Negative for diaphoresis and fever.  HENT:  Negative for sore throat.   Respiratory:  Negative for cough and shortness of breath.   Cardiovascular:  Negative for chest pain and palpitations.  Gastrointestinal:  Negative for constipation, diarrhea, nausea and vomiting.  Musculoskeletal:  Negative for myalgias.  Skin:  Negative for rash.  Neurological:  Negative for dizziness, tremors and headaches.  Psychiatric/Behavioral:  Negative for depression, hallucinations and suicidal ideas. The patient is not nervous/anxious and does not have insomnia.   All other systems reviewed and are negative. Blood pressure 118/78, pulse 72, temperature 97.6 F (36.4 C), temperature source Oral, resp. rate 18, height _0  (1.702 m), weight 73.5 kg, SpO2 100 %. Body mass index is 25.37 kg/m.   Treatment Plan Summary:  The patient is a 20 year old male with schizophrenia undifferentiated type and cannabis use disorder admitted with worsening psychotic symptoms in the context of incomplete medication adherence and daily cannabis use.  Patient is improved from admission with more organized thought processes, better relatedness, more spontaneous speech, and he is no longer endorsing hallucinations or cognitive difficulty.  Patient lacks insight that he has a psychiatric illness.  He has improved on current medications but has declined a trial of a different long-acting antipsychotic that might allow him to be treated more effectively on antipsychotic monotherapy.  Daily contact with patient to assess and evaluate symptoms and progress in treatment and Medication management  Continue IVC status  Schizophrenia, undifferentiated type -Patient has shown improvement on combination of Abilify Maintena LAI and low-dose Haldol during this hospitalization.  He has declined trial of other antipsychotics. -Continue Haldol 5 mg PO twice  daily -Patient has declined to start trial of long-acting injectable antipsychotic other than Abilify Maintena during this hospitalization.  Patient has been encouraged to consider future trial of different long-acting injectable antipsychotic such as Haldol decanoate or Kirt Boys in the outpatient setting.  He does not want to undertake a new trial in the hospital. -Continue Abilify Maintena 400 mg q. 28 days with next dose due on 01/17/2021.  -Continue Depakote DR 250 mg every 12 hours for mood stabilization.  Valproic acid level, repeat CBC and differential and hepatic function panel have been ordered for the a.m. draw tomorrow 01/17/2021.  EPS  -Continue benztropine 0.5 mg every 6 hours as needed for tremors, EPS -Continue diphenhydramine 50 mg IM every 6 hours as needed for acute dystonic reaction  Anxiety -Continue hydroxyzine 25 mg 3 times daily as needed for anxiety  Agitation -Continue lorazepam 1 mg every 6 hours as needed for agitation, restlessness -Continue lorazepam 2 mg IM every 6 hours as needed for severe agitation where patient is unable to take oral PRN medications.  Insomnia -Continue trazodone 50 mg nightly as needed for insomnia  Discharge planning in progress.  Social work has courage patient's father to file petition for guardian to be appointed.  Housing plan needs to be clarified.  Social work is attempting to get patient on the waiting list or connect patient directly with ACT team if possible.    Arthor Captain, MD 01/16/2021, 3:49 PM

## 2021-01-16 NOTE — Progress Notes (Signed)
Progress note    01/16/21 0803  Psych Admission Type (Psych Patients Only)  Admission Status Involuntary  Psychosocial Assessment  Patient Complaints None  Eye Contact Fair  Facial Expression Pensive  Affect Appropriate to circumstance  Speech Logical/coherent  Interaction Cautious;Forwards little  Motor Activity Slow  Appearance/Hygiene In hospital gown  Behavior Characteristics Cooperative;Appropriate to situation  Mood Pleasant  Thought Process  Coherency Concrete thinking  Content WDL  Delusions WDL  Perception WDL  Hallucination None reported or observed  Judgment Limited  Confusion None  Danger to Self  Current suicidal ideation? Denies  Danger to Others  Danger to Others None reported or observed

## 2021-01-17 ENCOUNTER — Encounter (HOSPITAL_COMMUNITY): Payer: Self-pay

## 2021-01-17 LAB — CBC WITH DIFFERENTIAL/PLATELET
Abs Immature Granulocytes: 0.05 10*3/uL (ref 0.00–0.07)
Basophils Absolute: 0 10*3/uL (ref 0.0–0.1)
Basophils Relative: 0 %
Eosinophils Absolute: 0.2 10*3/uL (ref 0.0–0.5)
Eosinophils Relative: 2 %
HCT: 45.8 % (ref 39.0–52.0)
Hemoglobin: 14.5 g/dL (ref 13.0–17.0)
Immature Granulocytes: 1 %
Lymphocytes Relative: 26 %
Lymphs Abs: 1.9 10*3/uL (ref 0.7–4.0)
MCH: 28.1 pg (ref 26.0–34.0)
MCHC: 31.7 g/dL (ref 30.0–36.0)
MCV: 88.8 fL (ref 80.0–100.0)
Monocytes Absolute: 0.5 10*3/uL (ref 0.1–1.0)
Monocytes Relative: 7 %
Neutro Abs: 4.7 10*3/uL (ref 1.7–7.7)
Neutrophils Relative %: 64 %
Platelets: 294 10*3/uL (ref 150–400)
RBC: 5.16 MIL/uL (ref 4.22–5.81)
RDW: 13.4 % (ref 11.5–15.5)
WBC: 7.4 10*3/uL (ref 4.0–10.5)
nRBC: 0 % (ref 0.0–0.2)

## 2021-01-17 LAB — HEPATIC FUNCTION PANEL
ALT: 17 U/L (ref 0–44)
AST: 18 U/L (ref 15–41)
Albumin: 4.2 g/dL (ref 3.5–5.0)
Alkaline Phosphatase: 51 U/L (ref 38–126)
Bilirubin, Direct: 0.1 mg/dL (ref 0.0–0.2)
Indirect Bilirubin: 0.4 mg/dL (ref 0.3–0.9)
Total Bilirubin: 0.5 mg/dL (ref 0.3–1.2)
Total Protein: 6.9 g/dL (ref 6.5–8.1)

## 2021-01-17 LAB — VALPROIC ACID LEVEL: Valproic Acid Lvl: 34 ug/mL — ABNORMAL LOW (ref 50.0–100.0)

## 2021-01-17 MED ORDER — HALOPERIDOL 5 MG PO TABS: 5.0000 mg | ORAL_TABLET | Freq: Two times a day (BID) | ORAL | 0 refills | Status: DC

## 2021-01-17 MED ORDER — DIVALPROEX SODIUM 250 MG PO DR TAB: 250.0000 mg | DELAYED_RELEASE_TABLET | Freq: Two times a day (BID) | ORAL | 0 refills | Status: DC

## 2021-01-17 MED ORDER — ARIPIPRAZOLE ER 400 MG IM SRER: 400.0000 mg | INTRAMUSCULAR | 0 refills | Status: DC

## 2021-01-17 MED ORDER — TRAZODONE HCL 50 MG PO TABS: 50.0000 mg | ORAL_TABLET | Freq: Every evening | ORAL | 0 refills | Status: DC | PRN

## 2021-01-17 NOTE — Plan of Care (Signed)
  Problem: Education: Goal: Will be free of psychotic symptoms Outcome: Progressing Goal: Knowledge of the prescribed therapeutic regimen will improve Outcome: Progressing   

## 2021-01-17 NOTE — BH IP Treatment Plan (Signed)
Interdisciplinary Treatment and Diagnostic Plan Update  01/17/2021 Time of Session:  Mario Lloyd MRN: [id number]  Principal Diagnosis: Schizophrenia, undifferentiated (HCC)  Secondary Diagnoses: Principal Problem:   Schizophrenia, undifferentiated (HCC) Active Problems:   Moderate cannabis use disorder (HCC)   Current Medications:  Current Facility-Administered Medications  Medication Dose Route Frequency Provider Last Rate Last Admin   acetaminophen (TYLENOL) tablet 650 mg  650 mg Oral Q6H PRN Novella Olive, NP       alum & mag hydroxide-simeth (MAALOX/MYLANTA) 200-200-20 MG/5ML suspension 30 mL  30 mL Oral Q4H PRN Novella Olive, NP       ARIPiprazole ER (ABILIFY MAINTENA) injection 400 mg  400 mg Intramuscular Q28 days Claudie Revering, MD       benztropine (COGENTIN) tablet 0.5 mg  0.5 mg Oral Q6H PRN Claudie Revering, MD       diphenhydrAMINE (BENADRYL) injection 50 mg  50 mg Intramuscular Q6H PRN Claudie Revering, MD       divalproex (DEPAKOTE) DR tablet 250 mg  250 mg Oral Q12H Novella Olive, NP   250 mg at 01/17/21 0754   feeding supplement (ENSURE ENLIVE / ENSURE PLUS) liquid 237 mL  237 mL Oral BID BM Laveda Abbe, NP   237 mL at 01/16/21 1705   haloperidol (HALDOL) tablet 5 mg  5 mg Oral BID Claudie Revering, MD   5 mg at 01/17/21 4098   hydrOXYzine (ATARAX/VISTARIL) tablet 25 mg  25 mg Oral TID PRN Novella Olive, NP   25 mg at 01/14/21 2140   LORazepam (ATIVAN) injection 2 mg  2 mg Intramuscular Q6H PRN Claudie Revering, MD       LORazepam (ATIVAN) tablet 1 mg  1 mg Oral Q6H PRN Claudie Revering, MD       magnesium hydroxide (MILK OF MAGNESIA) suspension 30 mL  30 mL Oral Daily PRN Novella Olive, NP       traZODone (DESYREL) tablet 50 mg  50 mg Oral QHS PRN Novella Olive, NP   50 mg at 01/16/21 2056   PTA Medications: Medications Prior to Admission  Medication Sig Dispense Refill Last Dose   ABILIFY MAINTENA 400 MG SRER injection Inject 2 mLs (400 mg total)  into the muscle every 30 (thirty) days. Next dose due 01/17/21 1 each     albuterol (VENTOLIN HFA) 108 (90 Base) MCG/ACT inhaler Inhale 2 puffs into the lungs every 4 (four) hours as needed for wheezing or shortness of breath. (Patient not taking: No sig reported) 18 g 0    citalopram (CELEXA) 10 MG tablet Take 10 mg by mouth daily.      divalproex (DEPAKOTE) 250 MG DR tablet Take 1 tablet (250 mg total) by mouth every 12 (twelve) hours. (Patient not taking: No sig reported)      hydrOXYzine (ATARAX/VISTARIL) 25 MG tablet Take 1 tablet (25 mg total) by mouth 3 (three) times daily as needed for anxiety. (Patient not taking: No sig reported) 30 tablet 0    traZODone (DESYREL) 50 MG tablet Take 1 tablet (50 mg total) by mouth at bedtime and may repeat dose one time if needed. (Patient not taking: No sig reported)       Patient Stressors: Financial difficulties Medication change or noncompliance Substance abuse  Patient Strengths: Ability for insight Wellsite geologist fund of knowledge Physical Health  Treatment Modalities: Medication Management, Group therapy, Case management,  1 to 1 session with clinician,  Psychoeducation, Recreational therapy.   Physician Treatment Plan for Primary Diagnosis: Schizophrenia, undifferentiated (HCC) Long Term Goal(s): Improvement in symptoms so as ready for discharge   Short Term Goals: Ability to identify changes in lifestyle to reduce recurrence of condition will improve Ability to verbalize feelings will improve Ability to disclose and discuss suicidal ideas Ability to demonstrate self-control will improve Ability to identify and develop effective coping behaviors will improve Ability to maintain clinical measurements within normal limits will improve Compliance with prescribed medications will improve Ability to identify triggers associated with substance abuse/mental health issues will improve  Medication Management: Evaluate patient's  response, side effects, and tolerance of medication regimen.  Therapeutic Interventions: 1 to 1 sessions, Unit Group sessions and Medication administration.  Evaluation of Outcomes: Adequate for Discharge  Physician Treatment Plan for Secondary Diagnosis: Principal Problem:   Schizophrenia, undifferentiated (HCC) Active Problems:   Moderate cannabis use disorder (HCC)  Long Term Goal(s): Improvement in symptoms so as ready for discharge   Short Term Goals: Ability to identify changes in lifestyle to reduce recurrence of condition will improve Ability to verbalize feelings will improve Ability to disclose and discuss suicidal ideas Ability to demonstrate self-control will improve Ability to identify and develop effective coping behaviors will improve Ability to maintain clinical measurements within normal limits will improve Compliance with prescribed medications will improve Ability to identify triggers associated with substance abuse/mental health issues will improve     Medication Management: Evaluate patient's response, side effects, and tolerance of medication regimen.  Therapeutic Interventions: 1 to 1 sessions, Unit Group sessions and Medication administration.  Evaluation of Outcomes: Adequate for Discharge   RN Treatment Plan for Primary Diagnosis: Schizophrenia, undifferentiated (HCC) Long Term Goal(s): Knowledge of disease and therapeutic regimen to maintain health will improve  Short Term Goals: Ability to demonstrate self-control, Ability to participate in decision making will improve, and Ability to verbalize feelings will improve  Medication Management: RN will administer medications as ordered by provider, will assess and evaluate patient's response and provide education to patient for prescribed medication. RN will report any adverse and/or side effects to prescribing provider.  Therapeutic Interventions: 1 on 1 counseling sessions, Psychoeducation, Medication  administration, Evaluate responses to treatment, Monitor vital signs and CBGs as ordered, Perform/monitor CIWA, COWS, AIMS and Fall Risk screenings as ordered, Perform wound care treatments as ordered.  Evaluation of Outcomes: Adequate for Discharge   LCSW Treatment Plan for Primary Diagnosis: Schizophrenia, undifferentiated (HCC) Long Term Goal(s): Safe transition to appropriate next level of care at discharge, Engage patient in therapeutic group addressing interpersonal concerns.  Short Term Goals: Engage patient in aftercare planning with referrals and resources, Increase ability to appropriately verbalize feelings, and Increase emotional regulation  Therapeutic Interventions: Assess for all discharge needs, 1 to 1 time with Social worker, Explore available resources and support systems, Assess for adequacy in community support network, Educate family and significant other(s) on suicide prevention, Complete Psychosocial Assessment, Interpersonal group therapy.  Evaluation of Outcomes: Adequate for Discharge   Progress in Treatment: Attending groups: No. Participating in groups: No. Taking medication as prescribed: Yes. Toleration medication: Yes. Family/Significant other contact made: Yes, individual(s) contacted:  father Patient understands diagnosis: No. Discussing patient identified problems/goals with staff: Yes. Medical problems stabilized or resolved: Yes. Denies suicidal/homicidal ideation: Yes. Issues/concerns per patient self-inventory: No. Other: None  New problem(s) identified: No, Describe:  None  New Short Term/Long Term Goal(s):medication stabilization, elimination of SI thoughts, development of comprehensive mental wellness plan.   Patient Goals:  "  stay humble"  Discharge Plan or Barriers: Patient recently admitted. CSW will continue to follow and assess for appropriate referrals and possible discharge planning.   Reason for Continuation of Hospitalization:  Medication stabilization Withdrawal symptoms  Estimated Length of Stay: 3-5 days   Scribe for Treatment Team: Chrys Racer 01/17/2021 9:23 AM

## 2021-01-17 NOTE — Progress Notes (Signed)
   01/17/21 0141  Psych Admission Type (Psych Patients Only)  Admission Status Involuntary  Psychosocial Assessment  Patient Complaints None  Eye Contact Fair  Facial Expression Other (Comment) (appropriate)  Affect Appropriate to circumstance  Speech Logical/coherent  Interaction Cautious;Forwards little  Motor Activity Slow  Appearance/Hygiene In hospital gown  Behavior Characteristics Cooperative  Mood Pleasant  Aggressive Behavior  Targets  (no aggressive behaviors observed during this shift)  Thought Process  Coherency Concrete thinking  Content WDL  Delusions WDL  Perception WDL  Hallucination None reported or observed  Judgment Limited  Confusion None  Danger to Self  Current suicidal ideation? Denies  Danger to Others  Danger to Others None reported or observed

## 2021-01-17 NOTE — Progress Notes (Signed)
Recreation Therapy Notes  Date: 8.31.22 Time: 0930 Location: [address] Dayroom  Group Topic: Stress Management   Goal Area(s) Addresses:  Patient will actively participate in stress management techniques presented during session.  Patient will successfully identify benefit of practicing stress management post d/c.   Intervention: Relaxation exercise with ambient sound and script   Activity: Guided Imagery. LRT provided education, instruction, and demonstration on practice of visualization via guided imagery. Patient was asked to participate in the technique introduced during session. LRT debriefed including topics of mindfulness, stress management and specific scenarios each patient could use these techniques. Patients were given suggestions of ways to access scripts post d/c and encouraged to explore Youtube and other apps available on smartphones, tablets, and computers.  Education:  Stress Management, Discharge Planning.   Education Outcome: Acknowledges education  Clinical Observations/Feedback: Due to there being Lloyd COVID case on the unit group did not occur as scheduled.  LRT did give out packets dealing with symptoms and triggers of stress.  It also dealt with identifying things that are in your control and things that are not.     Mario Lloyd, LRT/CTRS         Mario Lloyd 01/17/2021 11:35 AM 

## 2021-01-17 NOTE — [facility] Suicide Risk Assessment (Signed)
[facility] Discharge Suicide Risk Assessment   Principal Problem: Schizophrenia, undifferentiated (HCC) Discharge Diagnoses: Principal Problem:   Schizophrenia, undifferentiated (HCC) Active Problems:   Moderate cannabis use disorder (HCC)   Total Time spent with patient: 20 minutes  Musculoskeletal: Strength & Muscle Tone: within normal limits Gait & Station: normal Patient leans: N/A  Psychiatric Specialty Exam  Presentation  General Appearance: Casual; Appropriate for Environment  Eye Contact:Good  Speech:Clear and Coherent; Normal Rate  Speech Volume:Normal  Handedness:Right   Mood and Affect  Mood:Euthymic  Duration of Depression Symptoms: Greater than two weeks  Affect:Appropriate   Thought Process  Thought Processes:Coherent; Goal Directed  Descriptions of Associations:Intact  Orientation:Full (Time, Place and Person)  Thought Content:Logical; WDL  History of Schizophrenia/Schizoaffective disorder:Yes  Duration of Psychotic Symptoms:Greater than six months  Hallucinations:Hallucinations: None  Ideas of Reference:None  Suicidal Thoughts:Suicidal Thoughts: No  Homicidal Thoughts:Homicidal Thoughts: No   Sensorium  Memory:Immediate Good; Recent Fair  Judgment:Fair  Insight:Shallow   Executive Functions  Concentration:Good  Attention Span:Good  Recall:Good  Fund of Knowledge:Good  Language:Good   Psychomotor Activity  Psychomotor Activity:Psychomotor Activity: Normal   Assets  Assets:Communication Skills; Social Support; Physical Health; Housing   Sleep  Sleep:Sleep: Good Number of Hours of Sleep: 5.5   Physical Exam: Physical Exam Vitals and nursing note reviewed.  Constitutional:      General: He is not in acute distress.    Appearance: Normal appearance. He is not diaphoretic.  HENT:     Head: Normocephalic and atraumatic.  Cardiovascular:     Rate and Rhythm: Normal rate.  Pulmonary:     Effort: Pulmonary effort  is normal.  Neurological:     General: No focal deficit present.     Mental Status: He is alert and oriented to person, place, and time.   Review of Systems  Constitutional: Negative.  Negative for chills, diaphoresis and fever.  HENT: Negative.  Negative for sore throat.   Respiratory: Negative.  Negative for cough and shortness of breath.   Cardiovascular: Negative.  Negative for chest pain and palpitations.  Gastrointestinal: Negative.  Negative for constipation, diarrhea, nausea and vomiting.  Genitourinary:  Negative for dysuria.  Musculoskeletal: Negative.  Negative for myalgias.  Skin:  Negative for rash.  Neurological: Negative.  Negative for dizziness, tremors and headaches.  Psychiatric/Behavioral:  Negative for depression, hallucinations and suicidal ideas. The patient is not nervous/anxious and does not have insomnia.   All other systems reviewed and are negative. Blood pressure 123/66, pulse 73, temperature 97.7 F (36.5 C), temperature source Oral, resp. rate 18, height 5\' 7"  (1.702 m), weight 73.5 kg, SpO2 100 %. Body mass index is 25.37 kg/m.  Mental Status Per Nursing Assessment::   On Admission:  NA  Demographic Factors:  Male, Adolescent or young adult, and Unemployed  Loss Factors: NA  Historical Factors: Impulsivity  Risk Reduction Factors:   Living with another person, especially a relative, Positive life satisfaction, Future oriented outlook and Positive social support  Continued Clinical Symptoms:  Anxiety  -  improved Alcohol/Substance Use/Abuse/Dependencies Schizophrenia:   Less than 45 years old  -  improved; denies symptoms Previous Psychiatric Diagnoses and Treatments  Cognitive Features That Contribute To Risk:  None    Suicide Risk:  Minimal acute risk: No identifiable suicidal ideation.  Patients presenting with no risk factors but with morbid ruminations; may be classified as minimal risk based on the severity of the depressive  symptoms   Follow-up Information     [facility] Schedule an appointment as soon as possible for a visit.   Why: Please call your provider to schedule an appointment for medication management services, as we were unable to contact. Contact information: [address] [place] [address] [phone]         [facility]. Call.   Specialty: Behavioral Health Why: Call to schedule an appointment to schedule SAIOP or substance use counseling. Contact information: [address] [place] [address] [phone]                 Plan Of Care/Follow-up recommendations:  Activity:  as tolerated  Tests:  You will periodically need to have blood drawn for lab work to monitor your cholesterol and blood sugar while you are taking Abilify/aripiprazole.  You also will periodically need to have blood drawn for lab work to monitor your liver function, blood count and Depakote level while you are taking Depakote/divalproex.  Your outpatient doctor will let you know when lab work needs to be performed.  Other:   -Take medications as prescribed.   -You received Abilify Maintena long-acting injectable 400 mg IM prior to discharge on 01/17/2021.  Your next monthly Abilify Maintena injection is due on approximately 02/14/2021.   -Do not drink alcohol.  Do not use marijuana/cannabis or other drugs.   -Attend substance abuse treatment program.   -Keep outpatient mental health follow-up appointments with therapist and psychiatrist.   -See your primary care provider for treatment of medical conditions.     Claudie Revering, MD 01/17/2021, 11:10 AM

## 2021-01-17 NOTE — Plan of Care (Signed)
Discharge note  Patient verbalizes readiness for discharge. Follow up plan explained, AVS, Transition record and SRA given. Prescriptions and teaching provided. Belongings returned and signed for. Suicide safety plan completed and signed. Patient verbalizes understanding. Patient denies SI/HI and assures this Clinical research associate they will seek assistance should that change. Patient discharged to lobby to wait for safe transport.  Problem: Education: Goal: Ability to state activities that reduce stress will improve Outcome: Adequate for Discharge   Problem: Coping: Goal: Ability to identify and develop effective coping behavior will improve Outcome: Adequate for Discharge   Problem: Self-Concept: Goal: Level of anxiety will decrease Outcome: Adequate for Discharge   Problem: Education: Goal: Knowledge of [facility] Education information/materials will improve Outcome: Adequate for Discharge Goal: Emotional status will improve Outcome: Adequate for Discharge Goal: Mental status will improve Outcome: Adequate for Discharge Goal: Verbalization of understanding the information provided will improve Outcome: Adequate for Discharge   Problem: Activity: Goal: Sleeping patterns will improve Outcome: Adequate for Discharge   Problem: Coping: Goal: Ability to verbalize frustrations and anger appropriately will improve Outcome: Adequate for Discharge Goal: Ability to demonstrate self-control will improve Outcome: Adequate for Discharge   Problem: Health Behavior/Discharge Planning: Goal: Identification of resources available to assist in meeting health care needs will improve Outcome: Adequate for Discharge Goal: Compliance with treatment plan for underlying cause of condition will improve Outcome: Adequate for Discharge   Problem: Physical Regulation: Goal: Ability to maintain clinical measurements within normal limits will improve Outcome: Adequate for Discharge   Problem:  Safety: Goal: Periods of time without injury will increase Outcome: Adequate for Discharge   Problem: Education: Goal: Will be free of psychotic symptoms 01/17/2021 1246 by Raylene Miyamoto, RN Outcome: Adequate for Discharge 01/17/2021 0827 by Raylene Miyamoto, RN Outcome: Progressing Goal: Knowledge of the prescribed therapeutic regimen will improve 01/17/2021 1246 by Raylene Miyamoto, RN Outcome: Adequate for Discharge 01/17/2021 0827 by Raylene Miyamoto, RN Outcome: Progressing   Problem: Coping: Goal: Coping ability will improve Outcome: Adequate for Discharge

## 2021-01-17 NOTE — Progress Notes (Signed)
  [facility] Adult Case Management Discharge Plan :  Will you be returning to the same living situation after discharge:  Yes,  staying with father At discharge, do you have transportation home?: Yes,  family to pick this pt up Do you have the ability to pay for your medications: Yes,  has insurance  Release of information consent forms completed and in the chart;  Patient's signature needed at discharge.  Patient to Follow up at:  Follow-up Information     [facility]. Schedule an appointment as soon as possible for a visit.   Why: Please call your provider to schedule an appointment for medication management services, as we were unable to contact. Contact information: [address] [place] [address] [phone]                 Next level of care provider has access to [facility] Link:no  Safety Planning and Suicide Prevention discussed: Yes,  with father     Has patient been referred to the Quitline?: Patient refused referral  Patient has been referred for addiction treatment: Pt. refused referral  Otelia Santee, LCSW 01/17/2021, 9:43 AM

## 2021-01-17 NOTE — Discharge Summary (Signed)
Physician Discharge Summary Note  Patient:  Mario Lloyd is an 20 y.o., male MRN:  [id number] DOB:  [birth year] Patient phone:  [phone] (home)  Patient address:   [address] [place] [id number],   Total Time spent with patient:  Greater than 30 minutes  Date of Admission:  01/11/2021  Date of Discharge: 01-17-21  Reason for Admission: Reported increased irritability, agitation, auditory/visual hallucinations, paranoia & feeling he is being watched and may be in danger.    Principal Problem: Schizophrenia, undifferentiated (HCC)  Discharge Diagnoses: Principal Problem:   Schizophrenia, undifferentiated (HCC) Active Problems:   Moderate cannabis use disorder (HCC)  Past Psychiatric History: Schizophrenia, Cannabis use disorder.  Past Medical History:  Past Medical History:  Diagnosis Date   Anxiety    Asthma    Depression    Eczema    Eczema    Psychosis (HCC) 06/07/2015   Substance induced mood disorder (HCC) 06/14/2015   History reviewed. No pertinent surgical history. Family History: History reviewed. No pertinent family history.  Family Psychiatric  History: See H&P  Social History:  Social History   Substance and Sexual Activity  Alcohol Use No     Social History   Substance and Sexual Activity  Drug Use Yes   Types: Marijuana   Comment: UDS was negative; Pt + Benzos    Social History   Socioeconomic History   Marital status: Single    Spouse name: Not on file   Number of children: Not on file   Years of education: Not on file   Highest education level: Not on file  Occupational History   Not on file  Tobacco Use   Smoking status: Never    Passive exposure: Never   Smokeless tobacco: Never  Vaping Use   Vaping Use: Never used  Substance and Sexual Activity   Alcohol use: No   Drug use: Yes    Types: Marijuana    Comment: UDS was negative; Pt + Benzos   Sexual activity: Never  Other Topics Concern   Not on file  Social  History Narrative   Not on file   Social Determinants of Health   Financial Resource Strain: Not on file  Food Insecurity: Not on file  Transportation Needs: Not on file  Physical Activity: Not on file  Stress: Not on file  Social Connections: Not on file   Hospital Course: (Per Md's admission evaluation notes): Mario Lloyd is a 20 year old male with history of schizophrenia, depression, anxiety and substance use (THC) who presented accompanied by his parents [facility] due to symptoms of increased irritability, agitation, auditory and visual hallucinations, paranoia and feeling he is being watched and may be in danger.  Patient's parents reported that patient made statements to others that he felt his father was going to kill him.  Patient's father reported that patient had missed 2 of his monthly Abilify Maintena injections in recent months and had become more aggressive with family and expressed fears that family members wanted to shoot him.  Patient endorsed irritability, easy anger, visual hallucinations of seeing colors, auditory hallucinations of "creepy voices, whispers, traumatizing voices", trouble sleeping, trouble concentrating and ongoing arguments with his family.  He endorsed daily cannabis use.  [facility] that patient does well when he is consistent with Abilify Maintena injections.  Per chart review most recent prescribed outpatient medications include Abilify Maintena 400 mg IM q. 28 days (next due 01/17/2021); Depakote  250 mg twice daily; Haldol 2 mg p.o. twice daily.  Exact date of most recent Abilify Maintena injection is unclear.  Patient was transferred to [facility] for medical clearance.  UDS was positive for THC and BAL was undetectable.  Head CT on 01/11/2021 showed no acute intracranial abnormality.  Patient was placed on IVC and was transferred to [facility] early this morning for further inpatient psychiatric treatment and  stabilization of psychotic symptoms.  On evaluation with me today, patient is pleasant but guarded and a poor historian.  His thought processes are disorganized and vague and there is paucity of thought content offered/elicited.  Patient states that he is here for "stress help" and tells me he has had problems with his mind for years, since 2016.  Patient has difficulty describing or providing any details regarding his symptoms.  He initially denies AH but later endorses AHs of "wraparound sounds" that "clutter" his mind and make it difficult for him to think.  Patient states that his parents wanted him to come to the hospital but he feels they "over exaggerate" his symptoms.  He does acknowledge that he may have been screaming at home.  Patient makes a vague paranoid statement that he may have been in danger at home because of his father.  Patient denies thought insertion, thought control, thought withdrawal, thought broadcasting, receipt of special messages, other first rank symptoms, VH, SI, AI or HI.  He denies experiencing anxiety, depression or irritability.  Patient reports that he has been experiencing increased sleep, decreased energy and trouble concentrating.  He denies drug use other than daily marijuana and denies alcohol use.  Patient reports most recently receiving his Abilify injection 2 to 3 weeks ago.  He says he has not been taking outpatient Depakote, trazodone, Atarax or albuterol.  He states that he takes Celexa every day does not know the dose.   Prior to this discharge, Mario Lloyd was seen & evaluated for mental health stability. The current laboratory findings were reviewed (stable). The nurses notes & vital signs were reviewed as well. There are no current mental health or medical issues that should prevent this discharge at this time. Patient is being discharged to continue mental health care & medication management as noted below.   This is one of several psychiatric admissions/discharge  summaries from the [place] systems for this 20 year old male with prior hx of mental illnesses & cannabis use disorder. He has been a patient in the [facility] health psychiatric wards during his adolescent's years. Chart review indicated patient has been receiving mental health care for at least since 2018. He has also made several ER visits due to worsening symptoms/psychosis. His admission reports indicated that patient did miss his monthly antipsychotic injectable for two straight months. He was brought to the hospital this time around for increased irritability, agitation, auditory/visual hallucinations, paranoia, feeling he is being watched & may be in danger. He was in need of evaluation & treatments. His UDS was positive for THC.  After evaluation of his presenting symptoms, Mario Lloyd was recommended for mood stabilization treatments. The medication regimen for his presenting symptoms were discussed & with his consent initiated. He received, stabilized & was discharged on the medications as listed below on his discharge medication lists. He was also enrolled & participated in the group counseling sessions being offered & held on this unit. He learned coping skills. He presented on this admission, no other chronic medical conditions that required treatment & monitoring. He tolerated his treatment  regimen without any adverse effects or reactions reported.  And because of the chronic nature of his psychiatric symptoms, their resistance to the treatment regimen in the past & hx o non-compliance to recommended treatment regimen, Mario Lloyd was treated, stabilized & discharged on two separate antipsychotic medications (Monthly injectable Abilify Maintenna & Haldol tabs). This is because he has not been able to achieve symptoms control under an antipsychotic monotherapy. However, the combination of these two antipsychotic medications seem effective in stabilizing his symptoms warranting this discharge. It will benefit  Mario Lloyd to continue on these combination antipsychotic therapies as recommended at this time till his symptoms completely subside. He then can be titrated down to an antipsychotic monotherapy to help decrease the chance for the development of metabolic syndrome usually associated with use of multiple antipsychotic therapies. This has to be done within the proper evaluation, judgement & discretion of his outpatient psychiatric provider.   During the course of his hospitalization, the 15-minute checks were adequate to ensure Mario Lloyd's safety.  Patient did not display any dangerous, violent or suicidal behavior on the unit. He interacted with patients & staff appropriately. He participated appropriately in the group sessions/therapies. His medications were addressed & adjusted to meet his needs. He was recommended for outpatient follow-up care & medication management upon discharge to assure his continuity of care.  At the time of discharge, patient is not reporting any acute suicidal/homicidal ideations. He currently denies any new issues or concerns. Education and supportive counseling provided throughout her hospital stay & upon discharge.  Today upon his discharge evaluation with the attending psychiatrist, Mario Lloyd presents mentally & medically stable. He denies any other specific concerns. He is sleeping well. His appetite is good. He denies other physical complaints. He denies AH/VH, delusional thoughts or paranoia. He does not appear to be responding to any internal stimuli. He feels that his medications have been helpful & is in agreement to continue his current treatment regimen as recommended. He was able to engage in safety planning including plan to return to [facility] or contact emergency services if he feels unable to maintain his own safety or the safety of others. Pt had no further questions, comments, or concerns. He left [facility] with all personal belongings in no apparent distress. Transportation per the  family.    Physical Findings: AIMS: Facial and Oral Movements Muscles of Facial Expression: None, normal Lips and Perioral Area: None, normal Jaw: None, normal Tongue: None, normal,Extremity Movements Upper (arms, wrists, hands, fingers): None, normal Lower (legs, knees, ankles, toes): None, normal, Trunk Movements Neck, shoulders, hips: None, normal, Overall Severity Severity of abnormal movements (highest score from questions above): None, normal Incapacitation due to abnormal movements: None, normal Patient's awareness of abnormal movements (rate only patient's report): No Awareness, Dental Status Current problems with teeth and/or dentures?: No Does patient usually wear dentures?: No  CIWA:    COWS:     Musculoskeletal: Strength & Muscle Tone: within normal limits Gait & Station: normal Patient leans: N/A  Psychiatric Specialty Exam:  Presentation  General Appearance: Casual; Appropriate for Environment  Eye Contact:Good  Speech:Clear and Coherent; Normal Rate  Speech Volume:Normal  Handedness:Right  Mood and Affect  Mood:Euthymic  Affect:Appropriate  Thought Process  Thought Processes:Coherent; Goal Directed  Descriptions of Associations:Intact  Orientation:Full (Time, Place and Person)  Thought Content:Logical; WDL  History of Schizophrenia/Schizoaffective disorder:Yes  Duration of Psychotic Symptoms:Greater than six months  Hallucinations:Hallucinations: None  Ideas of Reference:None  Suicidal Thoughts:Suicidal Thoughts: No  Homicidal Thoughts:Homicidal Thoughts: No  Sensorium  Memory:Immediate Good; Recent Fair  Judgment:Fair  Insight:Shallow  Executive Functions  Concentration:Good  Attention Span:Good  Recall:Good  Fund of Knowledge:Good  Language:Good  Psychomotor Activity  Psychomotor Activity:Psychomotor Activity: Normal  Assets  Assets:Communication Skills; Social Support; Physical Health; Housing  Sleep   Sleep:Sleep: Good Number of Hours of Sleep: 5.5  Physical Exam: Physical Exam Vitals and nursing note reviewed.  HENT:     Head: Normocephalic.     Nose: Nose normal.     Mouth/Throat:     Pharynx: Oropharynx is clear.  Eyes:     Pupils: Pupils are equal, round, and reactive to light.  Cardiovascular:     Rate and Rhythm: Normal rate.     Pulses: Normal pulses.  Pulmonary:     Effort: Pulmonary effort is normal.  Genitourinary:    Comments: Deferred Musculoskeletal:        General: Normal range of motion.     Cervical back: Normal range of motion.  Skin:    General: Skin is warm and dry.  Neurological:     General: No focal deficit present.     Mental Status: He is alert and oriented to person, place, and time. Mental status is at baseline.   Review of Systems  Constitutional:  Negative for chills, diaphoresis and fever.  HENT:  Negative for congestion and sore throat.   Eyes:  Negative for blurred vision.  Respiratory:  Negative for cough, shortness of breath and wheezing.   Cardiovascular:  Negative for chest pain and palpitations.  Gastrointestinal:  Negative for abdominal pain, constipation, diarrhea, heartburn, nausea and vomiting.  Genitourinary:  Negative for dysuria.  Musculoskeletal:  Negative for joint pain and myalgias.  Skin: Negative.   Neurological:  Negative for dizziness, tingling, tremors, sensory change, speech change, focal weakness, seizures, loss of consciousness, weakness and headaches.  Endo/Heme/Allergies:  Negative for environmental allergies and polydipsia. Does not bruise/bleed easily.       Allergies: NKDA  Psychiatric/Behavioral:  Positive for hallucinations and substance abuse (Hx. THC). Negative for depression, memory loss and suicidal ideas. The patient has insomnia (Hx of (stable on medication)). The patient is not nervous/anxious.   Blood pressure 123/66, pulse 73, temperature 97.7 F (36.5 C), temperature source Oral, resp. rate 18,  height  (1.702 m), weight 73.5 kg, SpO2 100 %. Body mass index is 25.37 kg/m.   Social History   Tobacco Use  Smoking Status Never   Passive exposure: Never  Smokeless Tobacco Never   Tobacco Cessation:  N/A, patient does not currently use tobacco products  Blood Alcohol level:  Lab Results  Component Value Date   ETH <10 01/10/2021   ETH <10 01/10/2021   Metabolic Disorder Labs:  Lab Results  Component Value Date   HGBA1C 5.4 01/10/2021   MPG 108.28 01/10/2021   MPG 105 08/14/2016   Lab Results  Component Value Date   PROLACTIN 0.3 (L) 01/10/2021   PROLACTIN 8.1 08/14/2016   Lab Results  Component Value Date   CHOL 131 01/12/2021   TRIG 47 01/12/2021   HDL 40 (L) 01/12/2021   CHOLHDL 3.3 01/12/2021   VLDL 9 01/12/2021   LDLCALC 82 01/12/2021   LDLCALC 61 08/14/2016    See Psychiatric Specialty Exam and Suicide Risk Assessment completed by Attending Physician prior to discharge.  Discharge destination:  Home  Is patient on multiple antipsychotic therapies at discharge:  Yes,   Do you recommend tapering to monotherapy for antipsychotics?   Yes, when patient's  symptoms stabilize.    Has Patient had three or more failed trials of antipsychotic monotherapy by history:  No, historically has been on Olanzapine in the past.   Recommended Plan for Multiple Antipsychotic Therapies: Taper to monotherapy as described: Per the outpatient psychiatric provider when patient's symptoms improve or stabilize. This is to prevent the development of metabolic syndrome usually associated with use of multiple antipsychotic medications.  Allergies as of 01/17/2021   No Known Allergies      Medication List     STOP taking these medications    citalopram 10 MG tablet Commonly known as: CELEXA   hydrOXYzine 25 MG tablet Commonly known as: ATARAX/VISTARIL       TAKE these medications      Indication  albuterol 108 (90 Base) MCG/ACT inhaler Commonly known as:  VENTOLIN HFA Inhale 2 puffs into the lungs every 4 (four) hours as needed for wheezing or shortness of breath.  Indication: Asthma   ARIPiprazole ER 400 MG Srer injection Commonly known as: ABILIFY MAINTENA Inject 2 mLs (400 mg total) into the muscle every 28 (twenty-eight) days. (DDue on 02-14-21): For mood control Start taking on: February 14, 2021 What changed:  when to take this additional instructions  Indication: Schizophrenia, Mood control   divalproex 250 MG DR tablet Commonly known as: DEPAKOTE Take 1 tablet (250 mg total) by mouth every 12 (twelve) hours. For mood stabilization What changed: additional instructions  Indication: Mood stabilization   haloperidol 5 MG tablet Commonly known as: HALDOL Take 1 tablet (5 mg total) by mouth 2 (two) times daily. For mood control  Indication: Mood control   traZODone 50 MG tablet Commonly known as: DESYREL Take 1 tablet (50 mg total) by mouth at bedtime as needed for sleep. What changed:  when to take this reasons to take this  Indication: Trouble Sleeping        Follow-up Information     [facility]. Schedule an appointment as soon as possible for a visit.   Why: Please call your provider to schedule an appointment for medication management services, as we were unable to contact. Contact information: [address] [place] [address] [phone]         [facility]. Call.   Specialty: Behavioral Health Why: Call to schedule an appointment to schedule SAIOP or substance use counseling. Contact information: [address] [place] [address] [phone]                Follow-up recommendations: Activity:  As tolerated Diet: As recommended by your primary care doctor. Keep all scheduled follow-up appointments as recommended.    Comments: Prescriptions given at discharge.  Patient agreeable to plan.  Given opportunity to ask questions.  Appears to feel  comfortable with discharge denies any current suicidal or homicidal thought. Patient is also instructed prior to discharge to: Take all medications as prescribed by his/her mental healthcare provider. Report any adverse effects and or reactions from the medicines to his/her outpatient provider promptly. Patient has been instructed & cautioned: To not engage in alcohol and or illegal drug use while on prescription medicines. In the event of worsening symptoms, patient is instructed to call the crisis hotline, 911 and or go to the nearest ED for appropriate evaluation and treatment of symptoms. To follow-up with his/her primary care provider for your other medical issues, concerns and or health care needs.   Signed: Armandina Stammer, NP, pmhnp, fnp-bc 01/17/2021, 10:28 AM

## 2021-02-09 ENCOUNTER — Other Ambulatory Visit: Payer: Self-pay

## 2021-02-09 ENCOUNTER — Emergency Department (HOSPITAL_COMMUNITY)
Admission: EM | Admit: 2021-02-09 | Discharge: 2021-02-09 | Disposition: A | Discharge status: Home / Self Care | Payer: Medicaid Other | Attending: Emergency Medicine | Admitting: Emergency Medicine

## 2021-02-09 ENCOUNTER — Encounter (HOSPITAL_COMMUNITY): Payer: Self-pay

## 2021-02-09 DIAGNOSIS — J45909 Unspecified asthma, uncomplicated: Secondary | ICD-10-CM | POA: Diagnosis not present

## 2021-02-09 DIAGNOSIS — U071 COVID-19: Secondary | ICD-10-CM | POA: Insufficient documentation

## 2021-02-09 DIAGNOSIS — R059 Cough, unspecified: Secondary | ICD-10-CM | POA: Diagnosis present

## 2021-02-09 MED ORDER — ACETAMINOPHEN 325 MG PO TABS
650.0000 mg | ORAL_TABLET | Freq: Once | ORAL | Status: AC
  Administered 2021-02-09: 650 mg via ORAL
  Filled 2021-02-09: qty 2

## 2021-02-09 NOTE — ED Triage Notes (Addendum)
Pt reports sore throat, cough and trouble breathing since last night, denies fever/chills. Pt received J&J vaccine a few years ago. Resp e.u. Pt states he took a home COVID test that was positive

## 2021-02-09 NOTE — ED Provider Notes (Signed)
[facility] EMERGENCY DEPARTMENT Provider Note   CSN: [id number] Arrival date & time: 02/09/21  5852     History Chief Complaint  Patient presents with   Cough   Nasal Congestion    Mario Lloyd is a 20 y.o. male presenting today with chills, non-productive cough, sore throat, rhinorrhea and body aches that began last night.  Patient took an at home COVID test that was positive.  Reports 1 [employer] vaccine at the beginning of the pandemic.  No reported sick contacts.  Reports "chest aches" and some shortness of breath. He has not tried anything to make the symptoms better however reports he feels better when he is sitting still.  Reports "I just want to feel better" so I came here.    Past Medical History:  Diagnosis Date   Anxiety    Asthma    Depression    Eczema    Eczema    Psychosis (HCC) 06/07/2015   Substance induced mood disorder (HCC) 06/14/2015    Patient Active Problem List   Diagnosis Date Noted   Moderate cannabis use disorder (HCC) 01/12/2021   Schizophrenia, undifferentiated (HCC) 01/11/2021   Schizophrenia, paranoid (HCC) 01/10/2021   MDD (major depressive disorder) 08/14/2016   Bipolar disorder with moderate depression (HCC) 08/14/2016   Seizure-like activity (HCC) 08/10/2016   Substance induced mood disorder (HCC) 06/14/2015   Severe major depression with psychotic features (HCC)    Psychosis (HCC) 06/07/2015    History reviewed. No pertinent surgical history.     No family history on file.  Social History   Tobacco Use   Smoking status: Never    Passive exposure: Never   Smokeless tobacco: Never  Vaping Use   Vaping Use: Never used  Substance Use Topics   Alcohol use: No   Drug use: Yes    Types: Marijuana    Comment: UDS was negative; Pt + Benzos    Home Medications Prior to Admission medications   Medication Sig Start Date End Date Taking? Authorizing Provider  albuterol (VENTOLIN HFA) 108 (90 Base)  MCG/ACT inhaler Inhale 2 puffs into the lungs every 4 (four) hours as needed for wheezing or shortness of breath. Patient not taking: No sig reported 11/19/19   Irean Hong, MD  ARIPiprazole ER (ABILIFY MAINTENA) 400 MG SRER injection Inject 2 mLs (400 mg total) into the muscle every 28 (twenty-eight) days. (DDue on 02-14-21): For mood control 02/14/21   Armandina Stammer I, NP  divalproex (DEPAKOTE) 250 MG DR tablet Take 1 tablet (250 mg total) by mouth every 12 (twelve) hours. For mood stabilization 01/17/21   Armandina Stammer I, NP  haloperidol (HALDOL) 5 MG tablet Take 1 tablet (5 mg total) by mouth 2 (two) times daily. For mood control 01/17/21   Armandina Stammer I, NP  traZODone (DESYREL) 50 MG tablet Take 1 tablet (50 mg total) by mouth at bedtime as needed for sleep. 01/17/21   Sanjuana Kava, NP    Allergies    Patient has no known allergies.  Review of Systems   Review of Systems  Constitutional:  Positive for chills and fever.  HENT:  Positive for congestion, rhinorrhea and sore throat.   Respiratory:  Positive for cough and shortness of breath.   Cardiovascular:  Positive for chest pain. Negative for palpitations.  All other systems reviewed and are negative.  Physical Exam Updated Vital Signs BP 119/71 (BP Location: Right Arm)   Pulse (!) 58  Temp 98.5 F (36.9 C) (Oral)   Resp 18   SpO2 100%   Physical Exam Vitals and nursing note reviewed.  Constitutional:      Appearance: Normal appearance.  HENT:     Head: Normocephalic and atraumatic.     Nose: Congestion present.     Mouth/Throat:     Mouth: Mucous membranes are moist.     Pharynx: Oropharynx is clear.  Eyes:     General: No scleral icterus.    Extraocular Movements: Extraocular movements intact.     Conjunctiva/sclera: Conjunctivae normal.     Pupils: Pupils are equal, round, and reactive to light.  Cardiovascular:     Rate and Rhythm: Normal rate and regular rhythm.     Heart sounds: No murmur heard. Pulmonary:      Effort: Pulmonary effort is normal. No respiratory distress.  Abdominal:     Tenderness: There is no abdominal tenderness.  Lymphadenopathy:     Cervical: No cervical adenopathy.  Skin:    General: Skin is warm.     Findings: No rash.  Neurological:     Mental Status: He is alert.     Motor: No weakness.     Gait: Gait normal.  Psychiatric:        Mood and Affect: Mood normal.        Behavior: Behavior normal.    ED Results / Procedures / Treatments   Labs (all labs ordered are listed, but only abnormal results are displayed) Labs Reviewed - No data to display  EKG None  Radiology No results found.  Procedures Procedures   Medications Ordered in ED Medications  acetaminophen (TYLENOL) tablet 650 mg (has no administration in time range)    ED Course  I have reviewed the triage vital signs and the nursing notes.  Pertinent labs & imaging results that were available during my care of the patient were reviewed by me and considered in my medical decision making (see chart for details).    MDM Rules/Calculators/A&P Mario Lloyd is a 20 y.o. male presenting today with chills, non-productive cough, sore throat, rhinorrhea and body aches that began last night.  Patient took an at home COVID test that was positive.  Reports 1 [employer] vaccine at the beginning of the pandemic.  No reported sick contacts.  Reports "chest aches" and some shortness of breath. He has not tried anything to make the symptoms better however reports he feels better when he is sitting still.  Reports "I just want to feel better" so I came here.  Patient was evaluated by me in triage.  He complained of known symptoms of COVID-19 as well as a positive at home test.  His lung sounds were clear and heart rate normal.  He endorsed some chest aches as well as occasional shortness of breath.  Oxygen saturation remained at 100% today.  No history of blood clot.  Patient reported that he came into chest  feel better.  We discussed potential treatment with Paxlovid.  Patient reported that he did not feel as though this was necessary.  He did not realize that he could treat COVID at home.  We discussed remedies and over-the-counter treatment.  He is agreeable to the plan to treat at home without prescription medication.  Denies the need for a prescription of Tylenol or ibuprofen.  Return precautions discussed as well as attached to his discharge papers.  I deferred testing for COVID as he already had an at home  positive test.  Flu would be treated the same way if he were positive.  Patient thankful for information about over-the-counter treatment.  Stable for discharge at this time.  Final Clinical Impression(s) / ED Diagnoses Final diagnoses:  COVID-19    Rx / DC Orders Results and diagnoses were explained to the patient. Return precautions discussed in full. Patient had no additional questions and expressed complete understanding.     Saddie Benders, PA-C 02/09/21 1134    Franne Forts, DO 02/11/21 1153

## 2021-02-09 NOTE — Discharge Instructions (Signed)
Over-the-counter remedies such as Tylenol and ibuprofen may help your fever.  As far as congestion goes, Mucinex will help break up the mucus in your sinuses.  Sore throat can be treated with cough drops, warm tea, lemon.  COVID-19 is an illness that we are currently mostly treating with over-the-counter remedies.  Anything that helps with flu symptoms will be okay for you to take.  Please return to the emergency department if you have crushing pain, you cannot catch her breath, a fever that you are unsuccessfully lowering with Tylenol and ibuprofen, and the feeling as though you may pass out.  Please return if your symptoms become overall worse and are not improving within the next 10 days.

## 2021-08-18 ENCOUNTER — Other Ambulatory Visit: Payer: Self-pay

## 2021-08-18 ENCOUNTER — Encounter (HOSPITAL_COMMUNITY): Payer: Self-pay | Admitting: Emergency Medicine

## 2021-08-18 ENCOUNTER — Emergency Department (HOSPITAL_COMMUNITY)
Admission: EM | Admit: 2021-08-18 | Discharge: 2021-08-19 | Disposition: A | Discharge status: Home / Self Care | Payer: Medicaid Other | Attending: Emergency Medicine | Admitting: Emergency Medicine

## 2021-08-18 DIAGNOSIS — Z046 Encounter for general psychiatric examination, requested by authority: Secondary | ICD-10-CM | POA: Diagnosis present

## 2021-08-18 DIAGNOSIS — Z008 Encounter for other general examination: Secondary | ICD-10-CM

## 2021-08-18 DIAGNOSIS — F200 Paranoid schizophrenia: Secondary | ICD-10-CM | POA: Diagnosis present

## 2021-08-18 DIAGNOSIS — J45909 Unspecified asthma, uncomplicated: Secondary | ICD-10-CM | POA: Insufficient documentation

## 2021-08-18 DIAGNOSIS — F4320 Adjustment disorder, unspecified: Secondary | ICD-10-CM

## 2021-08-18 DIAGNOSIS — Z20822 Contact with and (suspected) exposure to covid-19: Secondary | ICD-10-CM | POA: Insufficient documentation

## 2021-08-18 DIAGNOSIS — F323 Major depressive disorder, single episode, severe with psychotic features: Secondary | ICD-10-CM | POA: Diagnosis present

## 2021-08-18 LAB — RESP PANEL BY RT-PCR (FLU A&B, COVID) ARPGX2
Influenza A by PCR: NEGATIVE
Influenza B by PCR: NEGATIVE
SARS Coronavirus 2 by RT PCR: NEGATIVE

## 2021-08-18 LAB — CBG MONITORING, ED: Glucose-Capillary: 91 mg/dL (ref 70–99)

## 2021-08-18 NOTE — ED Notes (Signed)
Pt alert, calm, cooperative; denies SI/HI; this RN asked pt if he was having auditory or visual hallucinations; pt states, "yeah, something like that"; pt does not appear to be responding to any internal or external stimuli at this time ?

## 2021-08-18 NOTE — ED Notes (Signed)
Patient ambulatory to bed 48. Calm and cooperative at this time.  ?

## 2021-08-18 NOTE — ED Triage Notes (Signed)
Pt states he is here for "mental health".  Pt denies SI/HI.  Denies AH/VH.  States "I am having problems with my parents because they don't know what I have been through."   ? ?[employer] officers initially walked into triage with patient and then left.  They told tech that they were "working on [employer]" but did not speak with RN prior to leaving. ? ?Pt states he just wants to talk to someone. ? ? ?

## 2021-08-18 NOTE — ED Provider Notes (Addendum)
?[place] ?Provider Note ? ? ?CSN: [id number] ?Arrival date & time: 08/18/21  1238 ? ?  ? ?History ? ?No chief complaint on file. ? ? ?Mario Lloyd is a 21 y.o. male. ? ?HPI ? ?21 year old male with a history of major depression with psychotic features, schizophrenia, cannabis use disorder who presents to the emergency department with [employer] for mental health evaluation.  The patient states that he is here "to talk to someone I am having problems with my parents and I was hoping to talk to a counselor."  He denies any AVH.  He denies any SI or HI.  He states that he has not been taking his home medications.  No one is bedside to obtain collateral.  He initially arrived with [employer] who had mention that the patient may be IVC'd but I am not able to obtain further information regarding the patient. He denies any other complaints. ? ? ? ?Home Medications ?Prior to Admission medications   ?Medication Sig Start Date End Date Taking? Authorizing Provider  ?albuterol (VENTOLIN HFA) 108 (90 Base) MCG/ACT inhaler Inhale 2 puffs into the lungs every 4 (four) hours as needed for wheezing or shortness of breath. ?Patient not taking: No sig reported 11/19/19   Paulette Blanch, MD  ?ARIPiprazole ER (ABILIFY MAINTENA) 400 MG SRER injection Inject 2 mLs (400 mg total) into the muscle every 28 (twenty-eight) days. (DDue on 02-14-21): For mood control 02/14/21   Lindell Spar I, NP  ?divalproex (DEPAKOTE) 250 MG DR tablet Take 1 tablet (250 mg total) by mouth every 12 (twelve) hours. For mood stabilization 01/17/21   Lindell Spar I, NP  ?haloperidol (HALDOL) 5 MG tablet Take 1 tablet (5 mg total) by mouth 2 (two) times daily. For mood control 01/17/21   Lindell Spar I, NP  ?traZODone (DESYREL) 50 MG tablet Take 1 tablet (50 mg total) by mouth at bedtime as needed for sleep. 01/17/21   Encarnacion Slates, NP  ?   ? ?Allergies    ?Patient has no known allergies.   ? ?Review of Systems   ?Review of Systems  ?All  other systems reviewed and are negative. ? ?Physical Exam ?Updated Vital Signs ?BP 98/69   Pulse 81   Temp (!) 97.5 ?F (36.4 ?C) (Oral)   Resp 18   SpO2 97%  ?Physical Exam ?Vitals and nursing note reviewed.  ?Constitutional:   ?   General: He is not in acute distress. ?   Appearance: He is well-developed.  ?HENT:  ?   Head: Normocephalic and atraumatic.  ?Eyes:  ?   Conjunctiva/sclera: Conjunctivae normal.  ?Cardiovascular:  ?   Rate and Rhythm: Normal rate and regular rhythm.  ?   Heart sounds: No murmur heard. ?Pulmonary:  ?   Effort: Pulmonary effort is normal. No respiratory distress.  ?   Breath sounds: Normal breath sounds.  ?Abdominal:  ?   Palpations: Abdomen is soft.  ?Musculoskeletal:     ?   General: No swelling.  ?   Cervical back: Neck supple.  ?Skin: ?   General: Skin is warm and dry.  ?   Capillary Refill: Capillary refill takes less than 2 seconds.  ?Neurological:  ?   Mental Status: He is alert.  ?Psychiatric:     ?   Attention and Perception: Attention and perception normal.     ?   Mood and Affect: Mood normal. Affect is flat.     ?  Speech: Speech normal.     ?   Behavior: Behavior normal. Behavior is cooperative.     ?   Thought Content: Thought content normal.  ?   Comments: Pt does not appear to be responding to internal stimuli  ? ? ?ED Results / Procedures / Treatments   ?Labs ?(all labs ordered are listed, but only abnormal results are displayed) ?Labs Reviewed  ?RESP PANEL BY RT-PCR (FLU A&B, COVID) [id number]  ?CBG MONITORING, ED  ? ? ?EKG ?None ? ?Radiology ?No results found. ? ?Procedures ?Procedures  ? ? ?Medications Ordered in ED ?Medications - No data to display ? ?ED Course/ Medical Decision Making/ A&P ?  ?                        ?Medical Decision Making ? ?21 year old male with a history of major depression with psychotic features, schizophrenia, cannabis use disorder who presents to the emergency department with [employer] for mental health evaluation.  The patient states that he is  here "to talk to someone I am having problems with my parents and I was hoping to talk to a counselor."  He denies any AVH.  He denies any SI or HI.  He states that he has not been taking his home medications.  No one is bedside to obtain collateral.  He initially arrived with [employer] who had mention that the patient may be IVC'd but I am not able to obtain further information regarding the patient. He denies any other complaints. ? ?Attempts were made to contact the patient's father at the number listed in the chart for collateral. Unable to obtain collateral at this time. Patient is here voluntarily for evaluation. Reports he is not currently taking his home medications. Will keep patient under observation in the ED until collateral information can be obtained. Medically cleared for TTS evaluation.  ? ? ?Final Clinical Impression(s) / ED Diagnoses ?Final diagnoses:  ?Evaluation by psychiatric service required  ? ? ?Rx / DC Orders ?ED Discharge Orders   ? ? None  ? ?  ? ? ?  ?Regan Lemming, MD ?08/18/21 1333 ? ?  ?Regan Lemming, MD ?08/18/21 1336 ? ?

## 2021-08-19 DIAGNOSIS — F4320 Adjustment disorder, unspecified: Secondary | ICD-10-CM

## 2021-08-19 NOTE — Progress Notes (Addendum)
CSW provided the following resource for the patient to utilize upon discharge listed below: ? ?[facility]-will provide timely access to mental health services for children and adolescents (4-17) and adults presenting in a mental health crisis. The program is designed for those who need urgent Behavioral Health or Substance Use treatment and are not experiencing a medical crisis that would typically require an emergency room visit.  ?  ?[address] ?[place], [place] [address] ?Phone: [phone] ?[email or web address] ?  ?[facility] will also offer the following outpatient services: (Monday through Friday 8am-5pm) ?  ?Partial Hospitalization Program (PHP) ?Substance Abuse Intensive Outpatient Program (SA-IOP) ?Group Therapy ?Medication Management ?Peer Living Room ?  ?We also provide (24/7):  ?  ?Assessments: Our mental health clinician and providers will conduct a focused mental health evaluation, assessing for immediate safety concerns and further mental health needs. ?  ?Referral: Our team will provide resources and help connect to community based mental health treatment, when indicated, including psychotherapy, psychiatry, and other specialized behavioral health or substance use disorder services (for those not already in treatment). ?  ?Transitional Care: Our team providers in person bridging and/or telphonic follow-up during the patient's transition to outpatient services.  ? ?[facility] ?[address] ?[place], [place] [address] ?[phone] ? ?[facility] ?[address] ?[place], [place] [address] ?[phone] ?(Required copay upfront to hold appointment) ? ?[facility]:  (will also take occasional Medicaid) ?[place]: [address] [place], [place] [address] [phone]  ?[place]: [address] [place] [place], [place] [address] [phone] ?: [address], [place], [place] [address] [phone] ?Kathryne Sharper [address], [place], [place] [address] [phone] ? ?  ?  ?Crissie Reese, MSW, LCSW-A, LCAS-A ?Phone: [phone] ?Disposition/TOC ? ?

## 2021-08-19 NOTE — ED Notes (Signed)
Patient given discharge instructions, all questions answered. Patient in possession of all belongings, directed to the discharge area  

## 2021-08-19 NOTE — ED Notes (Signed)
Patient on tts 

## 2021-08-19 NOTE — Discharge Instructions (Addendum)
Please follow-up with information provided:  ? ?[facility]-will provide timely access to mental health services for children and adolescents (4-17) and adults presenting in a mental health crisis. The program is designed for those who need urgent Behavioral Health or Substance Use treatment and are not experiencing a medical crisis that would typically require an emergency room visit.  ?  ?[place] ?[place], [place] [address] ?Phone: [phone] ?[email or web address] ?  ?The [facility] will also offer the following outpatient services: (Monday through Friday 8am-5pm) ?  ?Partial Hospitalization Program (PHP) ?Substance Abuse Intensive Outpatient Program (SA-IOP) ?Group Therapy ?Medication Management ?Peer Living Room ?  ?We also provide (24/7):  ?  ?Assessments: Our mental health clinician and providers will conduct a focused mental health evaluation, assessing for immediate safety concerns and further mental health needs. ?  ?Referral: Our team will provide resources and help connect to community based mental health treatment, when indicated, including psychotherapy, psychiatry, and other specialized behavioral health or substance use disorder services (for those not already in treatment). ?  ?Transitional Care: Our team providers in person bridging and/or telphonic follow-up during the patient's transition to outpatient services.  ? ?[place] ?[place], [place] [address] ?[phone] ? ?[facility] ?[address] ?[place], [place] [address] ?[phone] ?(Required copay upfront to hold appointment) ? ?[place]:  (will also take occasional Medicaid) ?[place]: [place] [address] De[address], [place], [place] [address] [phone]  ?[place]: [address]rea [place], [place] [address] [phone] ?[place]: [address] [place], [place], [place] [address]  [phone] ?[address] [place], [place], [place] [address] [phone] ? ?  ?  ?   ?

## 2021-08-19 NOTE — ED Notes (Signed)
Patient quietly lying in the bed. Denies HI/SI. Denies any needs at this time.  ?

## 2021-08-19 NOTE — ED Provider Notes (Signed)
Emergency Medicine Observation Re-evaluation Note ? ?Mario Lloyd is a 21 y.o. male, seen on rounds today.  Pt initially presented to the ED for complaints of Psychiatric Evaluation ?Currently, the patient is resting on bed. ? ?Physical Exam  ?BP 136/81 (BP Location: Right Arm)   Pulse (!) 44   Temp 97.6 ?F (36.4 ?C) (Oral)   Resp 18   SpO2 100%  ?Physical Exam ?General: Awake, calm ?Cardiac: Extremities well perfused ?Lungs: Breathing is unlabored ?Psych: No agitation, does not appear to be responding to internal stimuli ? ?ED Course / MDM  ?EKG:  ? ?I have reviewed the labs performed to date as well as medications administered while in observation.  Recent changes in the last 24 hours include patient presented to the ED yesterday after an argument with family.  Evaluated by TTS this morning.  Recommendations pending. ? ?Plan  ?Current plan is for TTS recommendations. ? ATLEY NEUBERT is not under involuntary commitment. ? ? ?  ?Gloris [place], MD ?08/19/21 1216 ? ?

## 2021-08-19 NOTE — Consult Note (Signed)
Telepsych Consultation  ? ?Reason for Consult:  Psychiatric Reassessment ?Referring Physician:  Dr. Regan Lemming ?Location of Patient:    [name] ?Location of Provider: Other: virtual home office ? ?Patient Identification: Mario Lloyd ?MRN:  [id number] ?Principal Diagnosis: Adjustment disorder ?Diagnosis:  Principal Problem: ?  Adjustment disorder ?Active Problems: ?  Severe major depression with psychotic features ([place]) ?  Schizophrenia, paranoid ([place]) ? ? ?Total Time spent with patient: 30 minutes ? ?Subjective:   ?Mario Lloyd is a 21 y.o. male patient who presented to the ED with [employer] for mental health evaluation.  Initially thought to be under IVC but the admitting provider could not find documentation to substantiate this. Today, patient states, "how are you.  Let me sit up to talk with you." ? ?HPI:   ?Patient seen via telepsych by this provider; chart reviewed and consulted with Dr. Dwyane Dee on 08/19/21.  On evaluation Mario Lloyd reports most of what has already been captured by Mario Lloyd admission assessment.  States he got into an argument with his father became upset and decided to leave the house, came to the emergency room to talk with someone to help calm down. States he lives at home with his father and "stepmother".  States he does not work, but is looking for employment, reports he's limited d/t his mental illness.  Pt states he became upset when his father reminded him that he needed to get a job.  He's somewhat guarded and does not provide additional details about the encounter, pt appears embarrassed.  ? ?Patient followed by outpatient psychiatry for medication management.  Reports he gets a LAI q28 days and also take po medications that states he does not take as prescribed--tells me he does not believe he needs it.  He is not connected with an ACTT team but when discussed today, he states he's interested.  Pt verbally gives permission to reach out to his father and  stepmother for collateral.  ? ?During evaluation Mario Lloyd is laying in bed eating yogurt, fully dressed on street clothes. He is alert/oriented x 4; calm/cooperative; and mood congruent with affect.  Patient is speaking in a clear tone at moderate volume, and normal pace; with good eye contact.  His thought process is coherent and relevant; There is no indication that he is currently responding to internal/external stimuli or experiencing delusional thought content.  Patient denies suicidal/self-harm/homicidal ideation, psychosis, and paranoia.  Patient has remained calm throughout assessment and has answered questions appropriately.  ? ?Several unsuccessful attempts to reach pt's father Mario Lloyd.  Was able to reach [name], who identified herself as the girlfriend of pt's father.  [name] who states she is not legally married to Mario Lloyd but currently lives with him and she reports she's helped raise pt since age 51.  Discussed how several unsuccessful attempts were made to reach patient's father to gain collateral. This Probation officer inquired why the police were called to their home she states, "that was between him and his dad" who is currently out of town and cannot be reached for collateral.  States she does not have safety concerns with patient returning home today.  Denies questions or concerns this Probation officer could address.  ? ? ?Per Ed Provider Admission Assessment 08/18/2021: ?History ?  ?No chief complaint on file. ?  ?  ?Mario Lloyd is a 21 y.o. male. ?  ?HPI ?  ?21 year old male with a history of major depression with  psychotic features, schizophrenia, cannabis use disorder who presents to the emergency department with [employer] for mental health evaluation.  The patient states that he is here "to talk to someone I am having problems with my parents and I was hoping to talk to a counselor."  He denies any AVH.  He denies any SI or HI.  He states that he has not been  taking his home medications.  No one is bedside to obtain collateral.  He initially arrived with [employer] who had mention that the patient may be IVC'd but I am not able to obtain further information regarding the patient. He denies any other complaints. ? ?Past Psychiatric History: schizophrenia ? ?Risk to Self: no ?Risk to Others:no   ?Prior Inpatient Therapy:  unknown ?Prior Outpatient Therapy:  yes ? ?Past Medical History:  ?Past Medical History:  ?Diagnosis Date  ? Anxiety   ? Asthma   ? Depression   ? Eczema   ? Eczema   ? Psychosis ([place]) 06/07/2015  ? Substance induced mood disorder ([place]) 06/14/2015  ? History reviewed. No pertinent surgical history. ?Family History: No family history on file. ?Family Psychiatric  History: unknown ?Social History:  ?Social History  ? ?Substance and Sexual Activity  ?Alcohol Use No  ?   ?Social History  ? ?Substance and Sexual Activity  ?Drug Use Yes  ? Types: Marijuana  ? Comment: Mario Lloyd was negative; Pt + Benzos  ?  ?Social History  ? ?Socioeconomic History  ? Marital status: Single  ?  Spouse name: Not on file  ? Number of children: Not on file  ? Years of education: Not on file  ? Highest education level: Not on file  ?Occupational History  ? Not on file  ?Tobacco Use  ? Smoking status: Never  ?  Passive exposure: Never  ? Smokeless tobacco: Never  ?Vaping Use  ? Vaping Use: Never used  ?Substance and Sexual Activity  ? Alcohol use: No  ? Drug use: Yes  ?  Types: Marijuana  ?  Comment: Mario Lloyd was negative; Pt + Benzos  ? Sexual activity: Never  ?Other Topics Concern  ? Not on file  ?Social History Narrative  ? Not on file  ? ?Social Determinants of Health  ? ?Financial Resource Strain: Not on file  ?Food Insecurity: Not on file  ?Transportation Needs: Not on file  ?Physical Activity: Not on file  ?Stress: Not on file  ?Social Connections: Not on file  ? ?Additional Social History: ?  ? ?Allergies:  No Known Allergies ? ?Labs:  ?Results for orders placed or performed during the hospital  encounter of 08/18/21 (from the past 48 hour(s))  ?CBG monitoring, ED     Status: None  ? Collection Time: 08/18/21  2:31 PM  ?Result Value Ref Range  ? Glucose-Capillary 91 70 - 99 mg/dL  ?  Comment: Glucose reference range applies only to samples taken after fasting for at least 8 hours.  ?Resp Panel by RT-PCR (Flu A&B, Covid) Nasopharyngeal Swab     Status: None  ? Collection Time: 08/18/21  2:34 PM  ? Specimen: Nasopharyngeal Swab; Nasopharyngeal(NP) swabs in vial transport medium  ?Result Value Ref Range  ? SARS Coronavirus 2 by RT PCR NEGATIVE NEGATIVE  ?  Comment: (NOTE) ?SARS-CoV-2 target nucleic acids are NOT DETECTED. ? ?The SARS-CoV-2 RNA is generally detectable in upper respiratory ?specimens during the acute phase of infection. The lowest ?concentration of SARS-CoV-2 viral copies this assay can detect is ?138 copies/mL. A  negative result does not preclude SARS-Cov-2 ?infection and should not be used as the sole basis for treatment or ?other patient management decisions. A negative result may occur with  ?improper specimen collection/handling, submission of specimen other ?than nasopharyngeal swab, presence of viral mutation(s) within the ?areas targeted by this assay, and inadequate number of viral ?copies(<138 copies/mL). A negative result must be combined with ?clinical observations, patient history, and epidemiological ?information. The expected result is Negative. ? ?Fact Sheet for Patients:  ?EntrepreneurPulse.com.au ? ?Fact Sheet for Healthcare Providers:  ?IncredibleEmployment.be ? ?This test is no t yet approved or cleared by the [place] FDA and  ?has been authorized for detection and/or diagnosis of SARS-CoV-2 by ?FDA under an Emergency Use Authorization (EUA). This EUA will remain  ?in effect (meaning this test can be used) for the duration of the ?COVID-19 declaration under Section 564(b)(1) of the Act, 21 ?U.S.C.section 360bbb-3(b)(1), unless the  authorization is terminated  ?or revoked sooner.  ? ? ?  ? Influenza A by PCR NEGATIVE NEGATIVE  ? Influenza B by PCR NEGATIVE NEGATIVE  ?  Comment: (NOTE) ?The Xpert Xpress SARS-CoV-2/FLU/RSV plus assay is inte

## 2021-08-19 NOTE — BH Assessment (Addendum)
Comprehensive Clinical Assessment (CCA) Note  08/19/2021 Mario Lloyd [id number] Disposition: Clinician talked with NP [name].  Clinician attempted twice to get in touch with father of patient. There is some question of why the police were called to the home.  Pt does not make it clear why they were called.  Clinician has called father's number twice and gotten no response.  Ene recommends getting collateral information to help with disposition.   Chief Complaint: No chief complaint on file.  Visit Diagnosis: Schizophrenia, undifferentiated (HCC)   CCA Screening, Triage and Referral (STR)  Patient Reported Information How did you hear about [name]? Legal System  What Is the Reason for Your Visit/Call Today? Pt says he got into an argument with his parents.  He lives with them.  Pt says they called the police.  He says his parents were saying he was not doing enough to help himself.  Pt say he isn't.  "I have been trying to get a job but it is hard for me to keep one."  Pt is seen by [facility] and is seen by [name].  He is seen monthly. Pt says he has not been taking his psych meds "because I feel like I don't need them."  He says he has not taken them in 2-3 weeks.   He says he cannot tell a difference in how he feels whether he takes his meds or not.  Pt says that he does not feel like counseling would be beneficial.  He is willing to try it again however.  Pt denies any SI, HI.  He denies any visual hallucinations.  He says he hears different sounds but does not know where it is coming from.  Pt uses marijuana daily, a blunt a day.  Last use was this morning (04/01).  Pt denies use of ETOH.  No access to guns.  Pt says he has been sleeping well and his appetite is good.  Pt gave permission to call his father.  How Long Has This Been Causing You Problems? 1-6 months  What Do You Feel Would Help You the Most Today? Treatment for Depression or other mood  problem   Have You Recently Had Any Thoughts About Hurting Yourself? No  Are You Planning to Commit Suicide/Harm Yourself At This time? No   Have you Recently Had Thoughts About Hurting Someone Mario Lloyd? No  Are You Planning to Harm Someone at This Time? No  Explanation: No data recorded  Have You Used Any Alcohol or Drugs in the Past 24 Hours? Yes  How Long Ago Did You Use Drugs or Alcohol? No data recorded What Did You Use and How Much? Marijuana   Do You Currently Have a Therapist/Psychiatrist? Yes  Name of Therapist/Psychiatrist: [name], NP for medication management.   Have You Been Recently Discharged From Any Office Practice or Programs? No  Explanation of Discharge From Practice/Program: No data recorded    CCA Screening Triage Referral Assessment Type of Contact: Tele-Assessment  Telemedicine Service Delivery:   Is this Initial or Reassessment? Initial Assessment  Date Telepsych consult ordered in [facility]:  08/18/21  Time Telepsych consult ordered in [facility]:  1332  Location of Assessment: [facility] ED  Provider Location: [name] Assessment Services   Collateral Involvement: Mario Lloyd, father, [phone]   Does Patient Have a Court Appointed Legal Guardian? No data recorded Name and Contact of Legal Guardian: No data recorded If Minor and Not Living with Parent(s), Who has  Custody? No data recorded Is CPS involved or ever been involved? In the Past  Is APS involved or ever been involved? Never   Patient Determined To Be At Risk for Harm To Self or Others Based on Review of Patient Reported Information or Presenting Complaint? No  Method: No data recorded Availability of Means: No data recorded Intent: No data recorded Notification Required: No data recorded Additional Information for Danger to Others Potential: No data recorded Additional Comments for Danger to Others Potential: No data recorded Are There Guns or Other Weapons in Your Home? No data  recorded Types of Guns/Weapons: No data recorded Are These Weapons Safely Secured?                            No data recorded Who Could Verify You Are Able To Have These Secured: No data recorded Do You Have any Outstanding Charges, Pending Court Dates, Parole/Probation? No data recorded Contacted To Inform of Risk of Harm To Self or Others: No data recorded   Does Patient Present under Involuntary Commitment? No  IVC Papers Initial File Date: No data recorded  [place] of Residence: [place]   Patient Currently Receiving the Following Services: Medication Management ([name], NP at [facility])   Determination of Need: Urgent (48 hours)   Options For Referral: Other: Comment (Disposition pending collateral information.)     CCA Biopsychosocial Patient Reported Schizophrenia/Schizoaffective Diagnosis in Past: Yes   Strengths: Pt has outpatient provider.   Mental Health Symptoms Depression:   Fatigue; Change in energy/activity; Irritability; Worthlessness   Duration of Depressive symptoms:  Duration of Depressive Symptoms: Greater than two weeks   Mania:   None   Anxiety:    None   Psychosis:   Hallucinations   Duration of Psychotic symptoms:  Duration of Psychotic Symptoms: Greater than six months   Trauma:   None   Obsessions:   None   Compulsions:   None   Inattention:   None   Hyperactivity/Impulsivity:   None   Oppositional/Defiant Behaviors:   None   Emotional Irregularity:   Chronic feelings of emptiness   Other Mood/Personality Symptoms:   Circumstantial, rambling speech    Mental Status Exam Appearance and self-care  Stature:   Average   Weight:   Average weight   Clothing:   Casual   Grooming:   Normal   Cosmetic use:   None   Posture/gait:   Normal   Motor activity:   Not Remarkable   Sensorium  Attention:   Normal   Concentration:   Normal   Orientation:   X5    Recall/memory:   Normal   Affect and Mood  Affect:   Depressed   Mood:   Depressed   Relating  Eye contact:   Normal   Facial expression:   Responsive   Attitude toward examiner:   Cooperative   Thought and Language  Speech flow:  Clear and Coherent   Thought content:   Appropriate to Mood and Circumstances   Preoccupation:   Ruminations   Hallucinations:   Auditory   Organization:  No data recorded  [employer] of Knowledge:   Average   Intelligence:   Average   Abstraction:   Functional   Judgement:   Fair   Reality Testing:   Adequate   Insight:   Fair   Decision Making:   Normal   Social Functioning  Social Maturity:  Impulsive   Social Judgement:   "Chief of Staff"   Stress  Stressors:   Family conflict   Coping Ability:   Normal   Skill Deficits:   Building services engineer   Supports:   Family; Friends/Service system     Religion:    Leisure/Recreation:    Exercise/Diet: Exercise/Diet Have You Gained or Lost A Significant Amount of Weight in the Past Six Months?: No Do You Follow a Special Diet?: No Do You Have Any Trouble Sleeping?: No   CCA Employment/Education Employment/Work Situation: Employment / Work Situation Employment Situation: Unemployed Has Patient ever Been in Equities trader?: No  Education: Education Last Grade Completed: 12 Did You Product manager?: No Did You Have An Individualized Education Program (IIEP): No   CCA Family/Childhood History Family and Relationship History: Family history Marital status: Single Does patient have children?: No  Childhood History:  Childhood History By whom was/is the patient raised?: Mother/father and step-parent Did patient suffer any verbal/emotional/physical/sexual abuse as a child?: No Has patient ever been sexually abused/assaulted/raped as an adolescent or adult?: No Witnessed domestic violence?: No Has patient been affected  by domestic violence as an adult?: No  Child/Adolescent Assessment:     CCA Substance Use Alcohol/Drug Use: Alcohol / Drug Use Pain Medications: See MAR Prescriptions: See MAR Over the Counter: See MAR History of alcohol / drug use?: Yes Substance #1 Name of Substance 1: Marijuana 1 - Age of First Use: 21 years of age 57 - Amount (size/oz): one blunt 1 - Frequency: Daily 1 - Duration: on going 1 - Last Use / Amount: 03/31 one bunt 1 - Method of Aquiring: illegal purchas 1- Route of Use: smokig                       ASAM's:  Six Dimensions of Multidimensional Assessment  Dimension 1:  Acute Intoxication and/or Withdrawal Potential:      Dimension 2:  Biomedical Conditions and Complications:      Dimension 3:  Emotional, Behavioral, or Cognitive Conditions and Complications:     Dimension 4:  Readiness to Change:     Dimension 5:  Relapse, Continued use, or Continued Problem Potential:     Dimension 6:  Recovery/Living Environment:     ASAM Severity Score:    ASAM Recommended Level of Treatment:     Substance use Disorder (SUD)    Recommendations for Services/Supports/Treatments:    Discharge Disposition:    DSM5 Diagnoses: Patient Active Problem List   Diagnosis Date Noted   Moderate cannabis use disorder (HCC) 01/12/2021   Schizophrenia, undifferentiated (HCC) 01/11/2021   Schizophrenia, paranoid (HCC) 01/10/2021   MDD (major depressive disorder) 08/14/2016   Bipolar disorder with moderate depression (HCC) 08/14/2016   Seizure-like activity (HCC) 08/10/2016   Substance induced mood disorder (HCC) 06/14/2015   Severe major depression with psychotic features (HCC)    Psychosis (HCC) 06/07/2015     Referrals to Alternative Service(s): Referred to Alternative Service(s):   Place:   Date:   Time:    Referred to Alternative Service(s):   Place:   Date:   Time:    Referred to Alternative Service(s):   Place:   Date:   Time:    Referred to Alternative  Service(s):   Place:   Date:   Time:     Wandra Mannan

## 2021-08-22 ENCOUNTER — Other Ambulatory Visit: Payer: Self-pay

## 2021-08-22 ENCOUNTER — Emergency Department (HOSPITAL_COMMUNITY)
Admission: EM | Admit: 2021-08-22 | Discharge: 2021-08-23 | Disposition: A | Discharge status: Other Acute Inpatient Hospital | Payer: Medicaid Other | Attending: Emergency Medicine | Admitting: Emergency Medicine

## 2021-08-22 DIAGNOSIS — Z79899 Other long term (current) drug therapy: Secondary | ICD-10-CM | POA: Diagnosis not present

## 2021-08-22 DIAGNOSIS — Z046 Encounter for general psychiatric examination, requested by authority: Secondary | ICD-10-CM | POA: Diagnosis not present

## 2021-08-22 DIAGNOSIS — F331 Major depressive disorder, recurrent, moderate: Secondary | ICD-10-CM | POA: Diagnosis not present

## 2021-08-22 DIAGNOSIS — R45850 Homicidal ideations: Secondary | ICD-10-CM | POA: Diagnosis not present

## 2021-08-22 DIAGNOSIS — Z20822 Contact with and (suspected) exposure to covid-19: Secondary | ICD-10-CM | POA: Insufficient documentation

## 2021-08-22 DIAGNOSIS — F203 Undifferentiated schizophrenia: Secondary | ICD-10-CM | POA: Diagnosis present

## 2021-08-22 DIAGNOSIS — F209 Schizophrenia, unspecified: Secondary | ICD-10-CM | POA: Insufficient documentation

## 2021-08-22 DIAGNOSIS — F329 Major depressive disorder, single episode, unspecified: Secondary | ICD-10-CM | POA: Diagnosis not present

## 2021-08-22 LAB — COMPREHENSIVE METABOLIC PANEL
ALT: 13 U/L (ref 0–44)
AST: 20 U/L (ref 15–41)
Albumin: 4.2 g/dL (ref 3.5–5.0)
Alkaline Phosphatase: 59 U/L (ref 38–126)
Anion gap: 6 (ref 5–15)
BUN: 12 mg/dL (ref 6–20)
CO2: 29 mmol/L (ref 22–32)
Calcium: 9.3 mg/dL (ref 8.9–10.3)
Chloride: 103 mmol/L (ref 98–111)
Creatinine, Ser: 1.27 mg/dL — ABNORMAL HIGH (ref 0.61–1.24)
GFR, Estimated: >60 mL/min (ref >60)
Glucose, Bld: 125 mg/dL — ABNORMAL HIGH (ref 70–99)
Potassium: 3.9 mmol/L (ref 3.5–5.1)
Sodium: 138 mmol/L (ref 135–145)
Total Bilirubin: 1 mg/dL (ref 0.3–1.2)
Total Protein: 6.7 g/dL (ref 6.5–8.1)

## 2021-08-22 LAB — ETHANOL: Alcohol, Ethyl (B): <10 mg/dL (ref <10)

## 2021-08-22 LAB — CBC WITH DIFFERENTIAL/PLATELET
Abs Immature Granulocytes: 0.01 10*3/uL (ref 0.00–0.07)
Basophils Absolute: 0 10*3/uL (ref 0.0–0.1)
Basophils Relative: 0 %
Eosinophils Absolute: 0.2 10*3/uL (ref 0.0–0.5)
Eosinophils Relative: 2 %
HCT: 43.5 % (ref 39.0–52.0)
Hemoglobin: 14.1 g/dL (ref 13.0–17.0)
Immature Granulocytes: 0 %
Lymphocytes Relative: 27 %
Lymphs Abs: 2 10*3/uL (ref 0.7–4.0)
MCH: 28.4 pg (ref 26.0–34.0)
MCHC: 32.4 g/dL (ref 30.0–36.0)
MCV: 87.5 fL (ref 80.0–100.0)
Monocytes Absolute: 0.6 10*3/uL (ref 0.1–1.0)
Monocytes Relative: 8 %
Neutro Abs: 4.6 10*3/uL (ref 1.7–7.7)
Neutrophils Relative %: 63 %
Platelets: 281 10*3/uL (ref 150–400)
RBC: 4.97 MIL/uL (ref 4.22–5.81)
RDW: 13 % (ref 11.5–15.5)
WBC: 7.5 10*3/uL (ref 4.0–10.5)
nRBC: 0 % (ref 0.0–0.2)

## 2021-08-22 LAB — RAPID URINE DRUG SCREEN, HOSP PERFORMED
Amphetamines: NOT DETECTED
Barbiturates: NOT DETECTED
Benzodiazepines: NOT DETECTED
Cocaine: NOT DETECTED
Opiates: NOT DETECTED
Tetrahydrocannabinol: POSITIVE — AB

## 2021-08-22 LAB — RESP PANEL BY RT-PCR (FLU A&B, COVID) ARPGX2
Influenza A by PCR: NEGATIVE
Influenza B by PCR: NEGATIVE
SARS Coronavirus 2 by RT PCR: NEGATIVE

## 2021-08-22 NOTE — ED Notes (Signed)
Cell phone and wallet given to security.  ?

## 2021-08-22 NOTE — ED Notes (Signed)
IVC paperwork faxed to [facility] 

## 2021-08-22 NOTE — ED Notes (Signed)
Belongings in locker #1 in purple zone. ?

## 2021-08-22 NOTE — ED Provider Notes (Signed)
?[facility] EMERGENCY DEPARTMENT ?Provider Note ? ? ?CSN: [id number] ?Arrival date & time: 08/22/21  0439 ? ?  ? ?History ? ?Chief Complaint  ?Patient presents with  ? Homicidal  ? ? ?Mario Lloyd is a 21 y.o. male. ? ?Pt is a 21 yo male with a pmhx significant for schizophrenia.  He was in the ED a few days ago and was d/c on 4/2 after being evaluated by mental health.  Pt was d/c and brought in this morning under IVC papers that were taken out by his parent due to concerns of HI towards the parent.  Pt denies si/hi.  He occasionally has visual hallucinations.  He denies taking meds for 2-3 weeks. ? ? ?  ? ?Home Medications ?Prior to Admission medications   ?Medication Sig Start Date End Date Taking? Authorizing Provider  ?ARIPiprazole ER (ABILIFY MAINTENA) 400 MG SRER injection Inject 2 mLs (400 mg total) into the muscle every 28 (twenty-eight) days. (DDue on 02-14-21): For mood control 02/14/21  Yes Armandina Stammer I, NP  ?citalopram (CELEXA) 10 MG tablet Take 10 mg by mouth daily. 07/14/21  Yes [provider]  ?traZODone (DESYREL) 50 MG tablet Take 1 tablet (50 mg total) by mouth at bedtime as needed for sleep. 01/17/21  Yes Armandina Stammer I, NP  ?albuterol (VENTOLIN HFA) 108 (90 Base) MCG/ACT inhaler Inhale 2 puffs into the lungs every 4 (four) hours as needed for wheezing or shortness of breath. ?Patient not taking: Reported on 01/03/2021 11/19/19   Irean Hong, MD  ?benztropine (COGENTIN) 0.5 MG tablet Take 0.5 mg by mouth at bedtime. ?Patient not taking: Reported on 08/22/2021 03/22/21   [provider]  ?divalproex (DEPAKOTE) 250 MG DR tablet Take 1 tablet (250 mg total) by mouth every 12 (twelve) hours. For mood stabilization ?Patient not taking: Reported on 08/18/2021 01/17/21   Armandina Stammer I, NP  ?haloperidol (HALDOL) 5 MG tablet Take 1 tablet (5 mg total) by mouth 2 (two) times daily. For mood control ?Patient not taking: Reported on 08/18/2021 01/17/21   Armandina Stammer I, NP  ?    ? ?Allergies    ?Patient has no known allergies.   ? ?Review of Systems   ?Review of Systems  ?Psychiatric/Behavioral:  Positive for hallucinations.   ?All other systems reviewed and are negative. ? ?Physical Exam ?Updated Vital Signs ?BP 108/71 (BP Location: Right Arm)   Pulse (!) 52   Temp 99.5 ?F (37.5 ?C) (Oral)   Resp 14   SpO2 98%  ?Physical Exam ?Vitals and nursing note reviewed.  ?Constitutional:   ?   Appearance: Normal appearance.  ?HENT:  ?   Head: Normocephalic and atraumatic.  ?   Right Ear: External ear normal.  ?   Left Ear: External ear normal.  ?   Nose: Nose normal.  ?   Mouth/Throat:  ?   Mouth: Mucous membranes are moist.  ?   Pharynx: Oropharynx is clear.  ?Eyes:  ?   Extraocular Movements: Extraocular movements intact.  ?   Conjunctiva/sclera: Conjunctivae normal.  ?   Pupils: Pupils are equal, round, and reactive to light.  ?Cardiovascular:  ?   Rate and Rhythm: Normal rate and regular rhythm.  ?   Pulses: Normal pulses.  ?   Heart sounds: Normal heart sounds.  ?Pulmonary:  ?   Effort: Pulmonary effort is normal.  ?   Breath sounds: Normal breath sounds.  ?Abdominal:  ?   General: Abdomen is flat.  Bowel sounds are normal.  ?   Palpations: Abdomen is soft.  ?Musculoskeletal:     ?   General: Normal range of motion.  ?   Cervical back: Normal range of motion and neck supple.  ?Skin: ?   General: Skin is warm.  ?   Capillary Refill: Capillary refill takes less than 2 seconds.  ?Neurological:  ?   General: No focal deficit present.  ?   Mental Status: He is alert and oriented to person, place, and time.  ?Psychiatric:     ?   Mood and Affect: Mood normal.     ?   Behavior: Behavior normal.  ?   Comments: Pt does not appear to be responding to internal stimuli.  No current si/hi.  ? ? ?ED Results / Procedures / Treatments   ?Labs ?(all labs ordered are listed, but only abnormal results are displayed) ?Labs Reviewed  ?COMPREHENSIVE METABOLIC PANEL - Abnormal; Notable for the following  components:  ?    Result Value  ? Glucose, Bld 125 (*)   ? Creatinine, Ser 1.27 (*)   ? All other components within normal limits  ?RESP PANEL BY RT-PCR (FLU A&B, COVID) ARPGX2  ?ETHANOL  ?CBC WITH DIFFERENTIAL/PLATELET  ?RAPID URINE DRUG SCREEN, HOSP PERFORMED  ? ? ?EKG ?None ? ?Radiology ?No results found. ? ?Procedures ?Procedures  ? ? ?Medications Ordered in ED ?Medications - No data to display ? ?ED Course/ Medical Decision Making/ A&P ?  ?                        ?Medical Decision Making ?Amount and/or Complexity of Data Reviewed ?Labs: ordered. ? ? ?This patient presents to the ED for concern of HI with IVC papers, this involves an extensive number of treatment options, and is a complaint that carries with it a high risk of complications and morbidity.  The differential diagnosis includes psychiatric emergency ? ? ?Co morbidities that complicate the patient evaluation ? ?schizophrenia ? ? ?Additional history obtained: ? ?Additional history obtained from epic chart review ? ?Lab Tests: ? ?I Ordered, and personally interpreted labs.  The pertinent results include:  cbc, cmp, etoh neg.  Covid/flu neg ? ?Critical Interventions: ? ?TTS eval.   ? ? ?Consultations Obtained: ? ?I requested consultation with the TTS,  and discussed lab and imaging findings as well as pertinent plan - they will consult ? ? ?Problem List / ED Course: ? ?Schizophrenia:  under IVC for HI.  Pt is medically clear for TTS eval. ? ? ?Reevaluation: ? ?After the interventions noted above, I reevaluated the patient and found that they have :stayed the same ? ? ?Social Determinants of Health: ? ?Lives at home ? ? ?Dispostion: ? ?After consideration of the diagnostic results and the patients response to treatment, I feel that the patent would benefit from psych eval.   ? ? ? ? ? ? ? ?Final Clinical Impression(s) / ED Diagnoses ?Final diagnoses:  ?Schizophrenia, unspecified type ([place])  ? ? ?Rx / DC Orders ?ED Discharge Orders   ? ? None  ? ?  ? ? ?   ?Mario Pence, MD ?08/22/21 1027 ? ?

## 2021-08-22 NOTE — ED Notes (Signed)
TC to TTS Dept and [name] name is on the list to be seen. The report given to this writer was he will be seen. ?

## 2021-08-22 NOTE — ED Notes (Signed)
TTS in process-Monique,RN  

## 2021-08-22 NOTE — ED Provider Triage Note (Signed)
Emergency Medicine Provider Triage Evaluation Note ? ?Mario Lloyd , a 21 y.o. male  was evaluated in triage.  Patient has a history of major depression with psychotic features, schizophrenia, cannabis use disorder.  He presents to the emergency department via [employer] under IVC.  Patient recently seen in the ED for behavioral health concerns and was discharged on April 2 after being evaluated by behavioral health.  He returns today under IVC that was placed by his parent.  Per IVC paperwork: ? ?"My son is currently in a state where he talks to himself, gets agitated easily, and has become increasingly aggressive.  On August 18, 2021 we called the police because he was hostile and he agreed to go with the police to behavioral health.  He checked himself out before he could get his medications in his body.  He does not take his meds and becomes very confrontational, and hostile toward me and others.  He gets Abilify injections once a month and sees a doctor but he is getting worse instead of better.  I am worried that he will hurt himself and others.  Tonight is the first time he mentioned that he would kill me.  He was irrational and hostile.  He needs help getting back on his meds." ? ?Patient denies any SI or HI.  He states that at times he feels that he sees things that are not there.  He cannot provide any specific details in regards to what these visions look like.  Denies any auditory hallucinations.  Denies any other somatic complaints.  He notes that he intermittently takes his medications. ? ?Physical Exam  ?BP 112/72 (BP Location: Left Leg)   Pulse 61   Temp 98.7 ?F (37.1 ?C)   Resp 17   SpO2 97%  ?Gen:   Awake, no distress   ?Resp:  Normal effort  ?MSK:   Moves extremities without difficulty  ?Other:   ? ?Medical Decision Making  ?Medically screening exam initiated at 4:42 AM.  Appropriate orders placed.  FENWICK PREVATT was informed that the remainder of the evaluation will be completed by another  provider, this initial triage assessment does not replace that evaluation, and the importance of remaining in the ED until their evaluation is complete. ? ?Patient under IVC.  We will obtain clearance labs.  Patient will require TTS evaluation. ?  ?Rayna Sexton, PA-C ?08/22/21 [id number] ? ?
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
## 2021-08-22 NOTE — ED Notes (Signed)
Pt wanded °

## 2021-08-23 ENCOUNTER — Ambulatory Visit (HOSPITAL_COMMUNITY)
Admission: EM | Admit: 2021-08-23 | Discharge: 2021-08-24 | Disposition: A | Discharge status: Home / Self Care | Payer: Medicaid Other | Source: Home / Self Care

## 2021-08-23 DIAGNOSIS — Z046 Encounter for general psychiatric examination, requested by authority: Secondary | ICD-10-CM | POA: Insufficient documentation

## 2021-08-23 DIAGNOSIS — F209 Schizophrenia, unspecified: Secondary | ICD-10-CM | POA: Insufficient documentation

## 2021-08-23 DIAGNOSIS — F331 Major depressive disorder, recurrent, moderate: Secondary | ICD-10-CM

## 2021-08-23 DIAGNOSIS — Z79899 Other long term (current) drug therapy: Secondary | ICD-10-CM | POA: Insufficient documentation

## 2021-08-23 DIAGNOSIS — F329 Major depressive disorder, single episode, unspecified: Secondary | ICD-10-CM

## 2021-08-23 LAB — VALPROIC ACID LEVEL: Valproic Acid Lvl: <10 ug/mL — ABNORMAL LOW (ref 50.0–100.0)

## 2021-08-23 MED ORDER — DIVALPROEX SODIUM 250 MG PO DR TAB
250.0000 mg | DELAYED_RELEASE_TABLET | Freq: Two times a day (BID) | ORAL | Status: DC
  Administered 2021-08-23 – 2021-08-24 (×3): 250 mg via ORAL
  Filled 2021-08-23 (×3): qty 1

## 2021-08-23 MED ORDER — BENZTROPINE MESYLATE 0.5 MG PO TABS
0.5000 mg | ORAL_TABLET | Freq: Two times a day (BID) | ORAL | Status: DC
  Administered 2021-08-23 – 2021-08-24 (×3): 0.5 mg via ORAL
  Filled 2021-08-23 (×3): qty 1

## 2021-08-23 MED ORDER — MAGNESIUM HYDROXIDE 400 MG/5ML PO SUSP: 30.0000 mL | Freq: Every day | ORAL | Status: DC | PRN

## 2021-08-23 MED ORDER — ALUM & MAG HYDROXIDE-SIMETH 200-200-20 MG/5ML PO SUSP: 30.0000 mL | ORAL | Status: DC | PRN

## 2021-08-23 MED ORDER — TRAZODONE HCL 50 MG PO TABS: 50.0000 mg | ORAL_TABLET | Freq: Every evening | ORAL | Status: DC | PRN

## 2021-08-23 MED ORDER — HALOPERIDOL 5 MG PO TABS
5.0000 mg | ORAL_TABLET | Freq: Two times a day (BID) | ORAL | Status: DC
  Administered 2021-08-23 – 2021-08-24 (×3): 5 mg via ORAL
  Filled 2021-08-23 (×3): qty 1

## 2021-08-23 MED ORDER — HYDROXYZINE HCL 25 MG PO TABS: 25.0000 mg | ORAL_TABLET | Freq: Three times a day (TID) | ORAL | Status: DC | PRN

## 2021-08-23 MED ORDER — ACETAMINOPHEN 325 MG PO TABS: 650.0000 mg | ORAL_TABLET | Freq: Four times a day (QID) | ORAL | Status: DC | PRN

## 2021-08-23 NOTE — Consult Note (Signed)
Telepsych Consultation  ? ?Reason for Consult:  Psych consult  ?Referring Physician:  Isla Pence, MD ?Location of Patient: [facility] ?Location of Provider: [facility] ? ?Patient Identification: Mario Lloyd ?MRN:  [id number] ?Principal Diagnosis: <principal problem not specified> ?Diagnosis:  Active Problems: ?  MDD (major depressive disorder) ?  Schizophrenia, undifferentiated ([place]) ? ? ?Total Time spent with patient: 20 minutes ? ?Subjective:   ?Mario Lloyd is a 21 y.o. male patient admitted to [facility] under IVC, petitioned by his father Mario Lloyd.  ? ?PER IVC, petitioner, Mario Lloyd, father, [phone]" ?My son is currently in a state where he talks to himself, get agitated easily and has become increasingly aggressive. On August 18 2021, we called the police because he was hostile and he agreed to go with the police to [place]. He checked himself out before he could get his medications in his body. He does not take his meds and becomes very confrontational, and hostile toward me and others. He gets Abilify infections once a month and sees a doctor but he is getting worse instead of better. I am worried that he will hurt himself and [name]. Tonight is the first time he mentioned that he would kill me. He was irrational hostile. He needs help getting back on his medications.  ? ? ?HPI: Patient seen an re-evaluated via tele health by this provider; chart reviewed and consulted with Dr. Dwyane Dee on 08/23/21. On evaluation Mario Lloyd is lying down in bed facing the camera with good eye contact. He is alert and oriented x3. His thought process is logical and speech is clear and coherent. His mood is depressed and affect is congruent. ? ?He is unable to recall why he was brought to the hospital for evaluation. He states that he was brought to the hospital because he does not have anywhere to go because his father kicked him out of the house. When asked if he threatened to kill his father, he states "I never  said anything like that, it is the other way around."  When asked to elaborate, he states that his father said he would kill him. When asked why did his father say that, he states "I do not know."  ? ?He denies suicidal and homicidal ideations. He denies auditory and visual hallucinations. There is no objective evidence that the patient is currently responding to internal or external stimuli or exhibiting any delusional or paranoia thought content. He endorses depressive symptoms of sadness, worthlessness, hopelessness, low self-esteem, and anhedonia for the past year. He states that nothing makes his mood better and everything makes it worse. When asked to identify what makes his mood worse, he states, "me not doing anything, like putting money in my pockets." He states that he does not have a job and hasn't been able to find work.  ? ?He denies drinking alcohol. He reports smoking marijuana "day by day" since sixth grade. He states that he resides with his father Mario and stepmother Mario Lloyd. He identifies his family as his support system. ? ?He states that he received outpatient services with Stephannie Peters at [facility]. He was advised that Stephannie Peters has moved to a new location, he states that he has seen her at the new location. He states that he is prescribed Abilify injection, trazodone, Depakote, Haldol, benztropine and Celexa. This provider restates the patient's list of medications and he does not recall taking Celexa. He states that he received the Abilify injection last month. It is unclear the  date he last received injection. He states that he has not taken the trazodone, benztropine, Depakote, or Haldol recently. ? ?I discussed with the patient restarting the Depakote, benztropine, and Haldol. Patient agreeable to the stated plan. Provider will obtain a valproic level and EKG prior to initiating medications. ? ?Past Psychiatric History: hx of schizophrenia. Last documented inpatient  hospitalization was at [facility] on August 2022. ? ?Risk to Self:  denies  ?Risk to Others:  denies  ?Prior Inpatient Therapy:  yes  ?Prior Outpatient Therapy:  yes  ? ?Past Medical History:  ?Past Medical History:  ?Diagnosis Date  ? Anxiety   ? Asthma   ? Depression   ? Eczema   ? Eczema   ? Psychosis ([place]) 06/07/2015  ? Substance induced mood disorder ([place]) 06/14/2015  ? No past surgical history on file. ?Family History: No family history on file. ?Family Psychiatric  History: No hx reported  ?Social History:  ?Social History  ? ?Substance and Sexual Activity  ?Alcohol Use No  ?   ?Social History  ? ?Substance and Sexual Activity  ?Drug Use Yes  ? Types: Marijuana  ? Comment: UDS was negative; Pt + Benzos  ?  ?Social History  ? ?Socioeconomic History  ? Marital status: Single  ?  Spouse name: Not on file  ? Number of children: Not on file  ? Years of education: Not on file  ? Highest education level: Not on file  ?Occupational History  ? Not on file  ?Tobacco Use  ? Smoking status: Never  ?  Passive exposure: Never  ? Smokeless tobacco: Never  ?Vaping Use  ? Vaping Use: Never used  ?Substance and Sexual Activity  ? Alcohol use: No  ? Drug use: Yes  ?  Types: Marijuana  ?  Comment: UDS was negative; Pt + Benzos  ? Sexual activity: Never  ?Other Topics Concern  ? Not on file  ?Social History Narrative  ? Not on file  ? ?Social Determinants of Health  ? ?Financial Resource Strain: Not on file  ?Food Insecurity: Not on file  ?Transportation Needs: Not on file  ?Physical Activity: Not on file  ?Stress: Not on file  ?Social Connections: Not on file  ? ?Additional Social History: ?  ? ?Allergies:  No Known Allergies ? ?Labs:  ?Results for orders placed or performed during the hospital encounter of 08/22/21 (from the past 48 hour(s))  ?Resp Panel by RT-PCR (Flu A&B, Covid) Nasopharyngeal Swab     Status: None  ? Collection Time: 08/22/21  8:10 AM  ? Specimen: Nasopharyngeal Swab; Nasopharyngeal(NP) swabs in vial transport medium   ?Result Value Ref Range  ? SARS Coronavirus 2 by RT PCR NEGATIVE NEGATIVE  ?  Comment: (NOTE) ?SARS-CoV-2 target nucleic acids are NOT DETECTED. ? ?The SARS-CoV-2 RNA is generally detectable in upper respiratory ?specimens during the acute phase of infection. The lowest ?concentration of SARS-CoV-2 viral copies this assay can detect is ?138 copies/mL. A negative result does not preclude SARS-Cov-2 ?infection and should not be used as the sole basis for treatment or ?other patient management decisions. A negative result may occur with  ?improper specimen collection/handling, submission of specimen other ?than nasopharyngeal swab, presence of viral mutation(s) within the ?areas targeted by this assay, and inadequate number of viral ?copies(<138 copies/mL). A negative result must be combined with ?clinical observations, patient history, and epidemiological ?information. The expected result is Negative. ? ?Fact Sheet for Patients:  ?EntrepreneurPulse.com.au ? ?Fact Sheet for Healthcare Providers:  ?  IncredibleEmployment.be ? ?This test is no t yet approved or cleared by the [place] FDA and  ?has been authorized for detection and/or diagnosis of SARS-CoV-2 by ?FDA under an Emergency Use Authorization (EUA). This EUA will remain  ?in effect (meaning this test can be used) for the duration of the ?COVID-19 declaration under Section 564(b)(1) of the Act, 21 ?U.S.C.section 360bbb-3(b)(1), unless the authorization is terminated  ?or revoked sooner.  ? ? ?  ? Influenza A by PCR NEGATIVE NEGATIVE  ? Influenza B by PCR NEGATIVE NEGATIVE  ?  Comment: (NOTE) ?The Xpert Xpress SARS-CoV-2/FLU/RSV plus assay is intended as an aid ?in the diagnosis of influenza from Nasopharyngeal swab specimens and ?should not be used as a sole basis for treatment. Nasal washings and ?aspirates are unacceptable for Xpert Xpress SARS-CoV-2/FLU/RSV ?testing. ? ?Fact Sheet for  Patients: ?EntrepreneurPulse.com.au ? ?Fact Sheet for Healthcare Providers: ?IncredibleEmployment.be ? ?This test is not yet approved or cleared by the [place] and ?has been authorized for detect

## 2021-08-23 NOTE — ED Provider Notes (Signed)
Emergency Medicine Observation Re-evaluation Note ? ?Mario Lloyd is a 21 y.o. male, seen on rounds today.  Pt initially presented to the ED for complaints of Homicidal ?Currently, the patient is being evaluated for homicidal ideation.. ? ?Physical Exam  ?BP 105/70 (BP Location: Right Arm)   Pulse (!) 50   Temp (!) 97.5 ?F (36.4 ?C) (Oral)   Resp 20   SpO2 99%  ?Physical Exam ?General: Comfortable, conversant ?Cardiac: Sinus bradycardia ?Lungs: Normal work of breathing ?Psych: Cooperative, calm, not agitated ? ?ED Course / MDM  ?EKG:  ? ?I have reviewed the labs performed to date as well as medications administered while in observation.  Recent changes in the last 24 hours include patient's heart rate in the 50s, patient has no symptoms/signs concerning.  No indication for further work-up for cardiac at this time.  Blood work reviewed normal potassium.  Patient stable for placement medically clear at this time.. ? ?Plan  ?Current plan is for awaiting placement/behavioral health.Marland Kitchen ?Mario Lloyd is under involuntary commitment. ?  ? ?  ?Blane Ohara, MD ?08/23/21 1052 ? ?

## 2021-08-23 NOTE — ED Notes (Signed)
Patient on TTS 

## 2021-08-23 NOTE — ED Notes (Signed)
[employer] here to transport patient.  

## 2021-08-23 NOTE — ED Notes (Signed)
Report called to Tommie Raymond, RN at [facility] ?

## 2021-08-23 NOTE — ED Notes (Signed)
Pt sleeping@this time. Breathing even and unlabored. Will continue to monitor for safety 

## 2021-08-23 NOTE — ED Notes (Signed)
Called to arrange transport for the patient to [facility] ?

## 2021-08-23 NOTE — ED Notes (Signed)
Patient given new scrubs and toiletries for shower ?
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
## 2021-08-23 NOTE — ED Provider Notes (Signed)
Behavioral Health Admission H&P ?(FBC & OBS) ? ?Date: 08/23/21 ?Patient Name: Mario Lloyd ?MRN: [id number] ?Chief Complaint: No chief complaint on file. ?   ? ?Diagnoses:  ?Final diagnoses:  ?Involuntary commitment  ?MDD (major depressive disorder), recurrent episode, moderate (HCC)  ?Schizophrenia, unspecified type (HCC)  ? ? ?HPI: Mario Lloyd is a 21 y.o. male patient admitted to [facility] under IVC, petitioned by his father [place] Hersh.  ?  ?PER IVC, petitioner, [place] Wesch, father, [phone]" ?My son is currently in a state where he talks to himself, get agitated easily and has become increasingly aggressive. On August 18 2021, we called the police because he was hostile and he agreed to go with the police to Behavior Health. He checked himself out before he could get his medications in his body. He does not take his meds and becomes very confrontational, and hostile toward me and others. He gets Abilify infections once a month and sees a doctor but he is getting worse instead of better. I am worried that he will hurt himself and [name]. Tonight is the first time he mentioned that he would kill me. He was irrational hostile. He needs help getting back on his medications.  ?  ?  ?Patient was transferred from [facility] to the [facility] for overnight observation: Patient seen an re-evaluated by this provider. On evaluation Mario Lloyd is alert and oriented x3. His thought process is logical and speech is clear and coherent. His mood is depressed and affect is congruent. He denies SI/HI/AVH. There is no evidence that he is responding to internal or external stimuli. He is agreeable to starting restarting Depakote 250 mg po BID, haldol 5 mg po BID and cogentin 0.5 mg po BID. Valproic level > 10. EKG-QTC is 368. UDS pos THC. ?  ?Per morning evaluation by this provider: He is unable to recall why he was brought to the hospital for evaluation. He states that he was brought to the hospital because he does not have anywhere  to go because his father kicked him out of the house. When asked if he threatened to kill his father, he states "I never said anything like that, it is the other way around."  When asked to elaborate, he states that his father said he would kill him. When asked why did his father say that, he states "I do not know."  ?  ?He denies suicidal and homicidal ideations. He denies auditory and visual hallucinations. There is no objective evidence that the patient is currently responding to internal or external stimuli or exhibiting any delusional or paranoia thought content. He endorses depressive symptoms of sadness, worthlessness, hopelessness, low self-esteem, and anhedonia for the past year. He states that nothing makes his mood better and everything makes it worse. When asked to identify what makes his mood worse, he states, "me not doing anything, like putting money in my pockets." He states that he does not have a job and hasn't been able to find work.  ?  ?He denies drinking alcohol. He reports smoking marijuana "day by day" since sixth grade. He states that he resides with his father [place] and stepmother Luna Kitchens. He identifies his family as his support system. ?  ?He states that he received outpatient services with [name] at [facility]. He was advised that [name] has moved to a new location, he states that he has seen her at the new location. He states that he is prescribed Abilify injection, trazodone, Depakote, Haldol, benztropine  and Celexa. This provider restates the patient's list of medications and he does not recall taking Celexa. He states that he received the Abilify injection last month. It is unclear the date he last received injection. He states that he has not taken the trazodone, benztropine, Depakote, or Haldol recently. ?  ?I discussed with the patient restarting the Depakote, benztropine, and Haldol. Patient agreeable to the stated plan. Provider will obtain a valproic  level and EKG prior to initiating medications. ? ? ?PHQ 2-9:  ?[facility]  ?Thoughts that you would be better off dead, or of hurting yourself in some way Not at all  ?PHQ-9 Total Score 5  ? ?  ?  ?[facility]  ?C-SSRS RISK CATEGORY High Risk No Risk No Risk  ? ?  ?  ? ?Total Time spent with patient: 20 minutes ? ?Musculoskeletal  ?Strength & Muscle Tone: within normal limits ?Gait & Station: normal ?Patient leans: N/A ? ?Psychiatric Specialty Exam  ?Presentation ?General Appearance: Appropriate for Environment ? ?Eye Contact:Fair ? ?Speech:Clear and Coherent ? ?Speech Volume:Normal ? ?Handedness:Right ? ? ?Mood and Affect  ?Mood:Depressed ? ?Affect:Congruent ? ? ?Thought Process  ?Thought Processes:Coherent; Goal Directed ? ?Descriptions of Associations:Intact ? ?Orientation:Full (Time, Place and Person) ? ?Thought Content:WDL ? Diagnosis of Schizophrenia or Schizoaffective disorder in past: Yes ? Duration of Psychotic Symptoms: Greater than six months ? ?Hallucinations:Hallucinations: None ? ?Ideas of Reference:None ? ?Suicidal Thoughts:Suicidal Thoughts: No ? ?Homicidal Thoughts:Homicidal Thoughts: No ? ? ?Sensorium  ?Memory:Immediate Fair; Recent Fair; Remote Fair ? ?Judgment:Fair ? ?Insight:Fair ? ? ?Executive Functions  ?Concentration:Fair ? ?Attention Span:Fair ? ?Recall:Fair ? ?Fund of Knowledge:Fair ? ?Language:Fair ? ? ?Psychomotor Activity  ?Psychomotor Activity:Psychomotor Activity: Normal ? ? ?Assets  ?Assets:Communication Skills; Desire for Improvement; Housing; Leisure Time; Physical Health ? ? ?Sleep  ?Sleep:Sleep: Fair ?Number of Hours of Sleep: 12 ? ? ?Nutritional Assessment (For OBS and FBC admissions only) ?Has the patient had a  weight loss or gain of 10 pounds or more in the last 3 months?: No ?Has the patient had a decrease in food intake/or appetite?: No ?Does the patient have dental problems?: No ?Does the patient have eating habits or behaviors that may be indicators of an eating disorder including binging or inducing vomiting?: No ?Has the patient recently lost weight without trying?: 0 ?Has the patient been eating poorly because of a decreased appetite?: 0 ?Malnutrition Screening Tool Score: 0 ? ? ? ?Physical Exam ?Constitutional:   ?   Appearance: Normal appearance.  ?HENT:  ?   Head: Normocephalic.  ?   Nose: Nose normal.  ?Eyes:  ?   Conjunctiva/sclera: Conjunctivae normal.  ?Cardiovascular:  ?   Rate and Rhythm: Normal rate.  ?Pulmonary:  ?   Effort: Pulmonary effort is normal.  ?Musculoskeletal:     ?   General: Normal range of motion.  ?   Cervical back: Normal range of motion.  ?Neurological:  ?   Mental Status: He is alert and oriented to person, place, and time.  ? ?Review of Systems  ?Constitutional: Negative.   ?HENT: Negative.    ?Eyes: Negative.   ?Respiratory: Negative.    ?Cardiovascular: Negative.   ?Gastrointestinal: Negative.   ?Genitourinary: Negative.   ?Musculoskeletal: Negative.   ?Skin: Negative.   ?Neurological:  Negative.   ?Endo/Heme/Allergies: Negative.   ? ? ?Past Psychiatric History:  ?hx of schizophrenia. Last documented inpatient hospitalization was at [facility] on August 2022. ? ?Is the patient at risk to self? No  ?Has the patient been a risk to self in the past 6 months? No .  ?Has the patient been a risk to self within the distant past? No   ?Is the patient a risk to others? Yes   ?Has the patient been a risk to others in the past 6 months? No   ?Has the patient been a risk to others within the distant past? No  ? ?Past Medical History:  ?Past Medical History:  ?Diagnosis Date  ? Anxiety   ? Asthma   ? Depression   ? Eczema   ? Eczema   ? Psychosis (HCC) 06/07/2015  ? Substance induced mood disorder (HCC)  06/14/2015  ? No past surgical history on file. ? ?Family History: No family history on file. ? ?Social History:  ?Social History  ? ?Socioeconomic History  ? Marital status: Single  ?  Spouse name: Not on fi

## 2021-08-23 NOTE — BH Assessment (Signed)
Comprehensive Clinical Assessment (CCA) Note ? ?08/23/2021 ?Mario Lloyd ?[id number] ? ?Disposition: ?Nira Conn, NP, recommends observation for safety and stabilization with psych reassessment in the AM. Monique, RN, informed of disposition. ? ?The patient demonstrates the following risk factors for suicide: Chronic risk factors for suicide include: psychiatric disorder of schizophrenia and previous suicide attempts none . Acute risk factors for suicide include: family or marital conflict. Protective factors for this patient include: positive social support, positive therapeutic relationship, responsibility to others (children, family), coping skills, and hope for the future. Considering these factors, the overall suicide risk at this point appears to be moderate. Patient is not appropriate for outpatient follow up. ? ?Flowsheet Row ED from 08/22/2021 in [facility] EMERGENCY DEPARTMENT ED from 08/18/2021 in [facility] EMERGENCY DEPARTMENT Admission (Discharged) [facility]  ?C-SSRS RISK CATEGORY No Risk No Risk No Risk  ? ?  ? ?Mario Lloyd is a 21 year old male presenting under [facility]C to [facility] due to HI. Patient has history of schizophrenia. Patient brought in by [employer] after making threats to fathers life. Patient denied SI, HI and psychosis. When asked, why are you here, patient stated "I don't have anything going on. Patient was not forthcoming with information and did not answer all questions. Patient reported main stressor includes lack of finances. Patient reported feeling hopeless. Patient denied prior suicide attempts and self-harming behaviors. Patient is currently seeing Mrs Aggie Cosier for medication management. Patient reported his medications are not working. Patient was calm, however he did not answer all questions.  ? ?PER IVC, petitioner, [place] Bergman, father, [phone],  ?unable to contact at this time, ?My son is  currently in a state where he talks to himself, get agitated easily and has become increasingly aggressive. On August 18 2021, we called the police because he was hostile and he agreed to go with the police to Behavior [facility]. He checked himself out before he could get his medications in his body. He does not take his meds and becomes very confrontational, and hostile toward me and others. He gets Abilify infections once a month and sees a doctor but he is getting worse instead of better. I am worried that he will hurt himself and [name]. Tonight is the first time he mentioned that he would kill me. He was irrational hostile. He needs help getting back on his medications.  ? ?Chief Complaint:  ?Chief Complaint  ?Patient presents with  ? Homicidal  ? ?Visit Diagnosis:  ?Schizophrenia ? ?CCA Screening, Triage and Referral (STR) ? ?Patient Reported Information ?How did you hear about [name]? Legal System ? ?What Is the Reason for Your Visit/Call Today? IVC and HI ? ?How Long Has This Been Causing You Problems? 1 wk - 1 month ? ?What Do You Feel Would Help You the Most Today? Treatment for Depression or other mood problem ? ? ?Have You Recently Had Any Thoughts About Hurting Yourself? No ? ?Are You Planning to Commit Suicide/Harm Yourself At This time? No ? ? ?Have you Recently Had Thoughts About Hurting Someone [name]? No ? ?Are You Planning to Harm Someone at This Time? No ? ?Explanation: No data recorded ? ?Have You Used Any Alcohol or Drugs in the Past 24 Hours? No ? ?How Long Ago Did You Use Drugs or Alcohol? No data recorded ?What Did You Use and How Much? Marijuana ? ? ?Do You Currently Have a Therapist/Psychiatrist? Yes ? ?Name of Therapist/Psychiatrist: Mrs.  Crystal ? ? ?Have You Been Recently Discharged From Any Office Practice or Programs? No ? ?Explanation of Discharge From Practice/Program: No data recorded ? ?  ?CCA Screening Triage Referral Assessment ?Type of Contact: Tele-Assessment ? ?Telemedicine Service  Delivery:   ?Is this Initial or Reassessment? Initial Assessment ? ?Date Telepsych consult ordered in [facility]:  08/22/21 ? ?Time Telepsych consult ordered in [facility]:  1026 ? ?Location of Assessment: [facility] ED ? ?Provider Location: [facility] Assessment Services ? ? ?Collateral Involvement: [place] Crumm, father, [phone], petitioner ? ? ?Does Patient Have a Automotive engineer Guardian? No data recorded ?Name and Contact of Legal Guardian: No data recorded ?If Minor and Not Living with Parent(s), Who has Custody? No data recorded ?Is CPS involved or ever been involved? In the Past ? ?Is APS involved or ever been involved? Never ? ? ?Patient Determined To Be At Risk for Harm To Self or Others Based on Review of Patient Reported Information or Presenting Complaint? No ? ?Method: No data recorded ?Availability of Means: No data recorded ?Intent: No data recorded ?Notification Required: No data recorded ?Additional Information for Danger to Others Potential: No data recorded ?Additional Comments for Danger to Others Potential: No data recorded ?Are There Guns or Other Weapons in Your Home? No data recorded ?Types of Guns/Weapons: No data recorded ?Are These Weapons Safely Secured?                            No data recorded ?Who Could Verify You Are Able To Have These Secured: No data recorded ?Do You Have any Outstanding Charges, Pending Court Dates, Parole/Probation? No data recorded ?Contacted To Inform of Risk of Harm To Self or Others: No data recorded ? ? ?Does Patient Present under Involuntary Commitment? Yes ? ?IVC Papers Initial File Date: 08/22/21 ? ? ?[place] of Residence: [place] ? ? ?Patient Currently Receiving the Following Services: Medication Management [name], NP at [facility]) ? ? ?Determination of Need: Urgent (48 hours) ? ? ?Options For Referral: Medication Management; Outpatient Therapy; Inpatient Hospitalization ? ? ? ? ?CCA Biopsychosocial ?Patient Reported  Schizophrenia/Schizoaffective Diagnosis in Past: Yes ? ? ?Strengths: Pt has outpatient provider. ? ? ?Mental Health Symptoms ?Depression:   ?Hopelessness; Irritability ?  ?Duration of Depressive symptoms:  ?Duration of Depressive Symptoms: Greater than two weeks ?  ?Mania:   ?None ?  ?Anxiety:    ?None ?  ?Psychosis:   ?None ?  ?Duration of Psychotic symptoms:    ?Trauma:   ?None ?  ?Obsessions:   ?None ?  ?Compulsions:   ?None ?  ?Inattention:   ?None ?  ?Hyperactivity/Impulsivity:   ?None ?  ?Oppositional/Defiant Behaviors:   ?None ?  ?Emotional Irregularity:   ?Chronic feelings of emptiness ?  ?Other Mood/Personality Symptoms:   ?Circumstantial, rambling speech ?  ? ?Mental Status Exam ?Appearance and self-care  ?Stature:   ?Average ?  ?Weight:   ?Average weight ?  ?Clothing:   ?Casual ?  ?Grooming:   ?Normal ?  ?Cosmetic use:   ?None ?  ?Posture/gait:   ?Normal ?  ?Motor activity:   ?Not Remarkable ?  ?Sensorium  ?Attention:   ?Normal ?  ?Concentration:   ?Normal ?  ?Orientation:   ?X5 ?  ?Recall/memory:   ?Normal ?  ?Affect and Mood  ?Affect:   ?Depressed ?  ?Mood:   ?Depressed ?  ?Relating  ?Eye contact:   ?Normal ?  ?Facial expression:   ?  Responsive ?  ?Attitude toward examiner:   ?Cooperative ?  ?Thought and Language  ?Speech flow:  ?Clear and Coherent ?  ?Thought content:   ?Appropriate to Mood and Circumstances ?  ?Preoccupation:   ?Ruminations ?  ?Hallucinations:   ?Auditory ?  ?Organization:  No data recorded  ?Executive Functions  ?Fund of Knowledge:   ?Average ?  ?Intelligence:   ?Average ?  ?Abstraction:   ?Functional ?  ?Judgement:   ?Fair ?  ?Reality Testing:   ?Adequate ?  ?Insight:   ?Lacking ?  ?Decision Making:   ?Normal ?  ?Social Functioning  ?Social Maturity:   ?Impulsive ?  ?Social Judgement:   ?"Street Smart" ?  ?Stress  ?Stressors:   ?Family conflict ?  ?Coping Ability:   ?Normal ?  ?Skill Deficits:   ?Communication; Decision making ?  ?Supports:   ?Family; Friends/Service system ?   ? ? ?Religion: ?Religion/Spirituality ?Are You A Religious Person?: No ?How Might This Affect Treatment?: UTA ? ?Leisure/Recreation: ?Leisure / Recreation ?Do You Have Hobbies?: Yes ?Leisure and Hobbies: making music and playing basketball ? ?

## 2021-08-24 MED ORDER — BENZTROPINE MESYLATE 0.5 MG PO TABS: 0.5000 mg | ORAL_TABLET | Freq: Two times a day (BID) | ORAL | 0 refills | Status: DC

## 2021-08-24 MED ORDER — DIVALPROEX SODIUM 250 MG PO DR TAB: 250.0000 mg | DELAYED_RELEASE_TABLET | Freq: Two times a day (BID) | ORAL | 0 refills | Status: DC

## 2021-08-24 MED ORDER — HALOPERIDOL 5 MG PO TABS
5.0000 mg | ORAL_TABLET | Freq: Two times a day (BID) | ORAL | 0 refills | Status: DC
Start: 2021-08-24 — End: 2021-11-22

## 2021-08-24 NOTE — ED Notes (Signed)
Sleeping  with easy respirations no distress noted ?

## 2021-08-24 NOTE — ED Notes (Signed)
Patient asleep in bed without issue or complaint.  Will monitor. 

## 2021-08-24 NOTE — ED Notes (Signed)
Remains asleep no distress noted. ?

## 2021-08-24 NOTE — BH Assessment (Signed)
[facility] Assessment Progress Note ?  ?Per Liborio Nixon, NP, this pt does not require psychiatric hospitalization at this time.  Pt is psychiatrically cleared.  Discharge instructions advise pt to follow up with [name], DNP at [facility] at his earliest opportunity.  Rodell Perna has been notified. ? ?Doylene Canning, MA ?Triage Specialist ?[phone] ? ?

## 2021-08-24 NOTE — ED Provider Notes (Signed)
FBC/OBS ASAP Discharge Summary ? ?Date and Time: 08/24/2021 12:12 PM  ?Name: Mario Lloyd  ?MRN:  [id number]  ? ?Discharge Diagnoses:  ?Final diagnoses:  ?Involuntary commitment  ?MDD (major depressive disorder), recurrent episode, moderate (HCC)  ?Schizophrenia, unspecified type (HCC)  ? ? ?Subjective: Patient seen and evaluated face-to-face by this provider, chart reviewed and case discussed with Dr. Bronwen BettersLaubach. On evaluation, patient is alert and oriented x4. His thought process is logical and speech is clear and coherent. His mood is euthymic and affect is congruent. Patient denies suicidal and homicidal ideations.  He denies auditory and visual hallucinations. There is no objective evidence that the patient is currently responding to internal or external stimuli. He denies depressive symptoms. He reports fair sleep. He reports a fair appetite. He denies medication side effects to the Haldol, Cogentin, and Depakote. He states that he will return back to his father's house if his father is okay with him coming back home. He states that he will continue to follow up with Leone [facility] for medication management. Patient has been compliant with current medication regimen. Patient has been observed on the unit without any aggressive, disruptive, self-harm, or psychotic behaviors. ? ?Stay Summary: Mario ClaudeJustin M Vancuren is a 21 y.o. male [facility], petitioned by his father [place]had Uballe.  ?  ?PER IVC, petitioner, [place] Delon, father, [phone]" ?My son is currently in a state where he talks to himself, get agitated easily and has become increasingly aggressive. On August 18 2021, we called the police because he was hostile and he agreed to go with the police to Behavior Health. He checked himself out before he could get his medications in his body. He does not take his meds and becomes very confrontational, and hostile toward me and others. He gets Abilify infections once a month and sees a doctor but  he is getting worse instead of better. I am worried that he will hurt himself and us. Tonight is the first time he mentioned that he would kill me. He was irrational hostile. He needs help getting back on his medications.  ? ?[facility]: Patient was restarted on medications Depakote 250 mg po BID, haldol 5 mg po BID and cogentin 0.5 mg po BID. Valproic level > 10. EKG-QTC is 368. UDS pos THC. ? ?Discharge plan: Follow up with Leone [facility] at [facility] for medication management. Doylene Canninghomas Hughes contacted [facility] to schedule patient an appointment for medication management, no answer. Patient advised to call on Monday to schedule an appointment.  ? ?Patient's father [place]had Monsour states that the patient cannot return back home. He states that the patient will have to go to a shelter. He states that the patient does not take his medications and chooses to get high. He states that the pill bottles in the patient's room are full. He states that the patient was making sexual advantages towards his wife and acting like an animal.  ?  ? ?Total Time spent with patient: 20 minutes ? ?Past Psychiatric History: hx of schizophrenia. [facility]. ?Past Medical History:  ?Past Medical History:  ?Diagnosis Date  ? Anxiety   ? Asthma   ? Depression   ? Eczema   ? Eczema   ? Psychosis (HCC) 06/07/2015  ? Substance induced mood disorder (HCC) 06/14/2015  ? No past surgical history on file. ?Family History: No family history on  file. ?Family Psychiatric History: No hx reported  ?Social History:  ?Social History  ? ?Substance and Sexual Activity  ?Alcohol Use No  ?   ?Social History  ? ?Substance and Sexual Activity  ?Drug Use Yes  ? Types: Marijuana  ? Comment: UDS was negative; Pt + Benzos  ?  ?Social History  ? ?Socioeconomic History  ? Marital status: Single  ?  Spouse name: Not on file  ?  Number of children: Not on file  ? Years of education: Not on file  ? Highest education level: Not on file  ?Occupational History  ? Not on file  ?Tobacco Use  ? Smoking status: Never  ?  Passive exposure: Never  ? Smokeless tobacco: Never  ?Vaping Use  ? Vaping Use: Never used  ?Substance and Sexual Activity  ? Alcohol use: No  ? Drug use: Yes  ?  Types: Marijuana  ?  Comment: UDS was negative; Pt + Benzos  ? Sexual activity: Never  ?Other Topics Concern  ? Not on file  ?Social History Narrative  ? Not on file  ? ?Social Determinants of Health  ? ?Financial Resource Strain: Not on file  ?Food Insecurity: Not on file  ?Transportation Needs: Not on file  ?Physical Activity: Not on file  ?Stress: Not on file  ?Social Connections: Not on file  ? ?SDOH:  ?SDOH Screenings  ? ?Alcohol Screen: Low Risk   ? Last Alcohol Screening Score (AUDIT): 0  ?Depression (PHQ2-9): Medium Risk  ? PHQ-2 Score: 5  ?Financial Resource Strain: Not on file  ?Food Insecurity: Not on file  ?Housing: Not on file  ?Physical Activity: Not on file  ?Social Connections: Not on file  ?Stress: Not on file  ?Tobacco Use: Low Risk   ? Smoking Tobacco Use: Never  ? Smokeless Tobacco Use: Never  ? Passive Exposure: Never  ?Transportation Needs: Not on file  ? ? ?Tobacco Cessation:  N/A, patient does not currently use tobacco products ? ?Current Medications:  ?Current Facility-Administered Medications  ?Medication Dose Route Frequency Provider Last Rate Last Admin  ? acetaminophen (TYLENOL) tablet 650 mg  650 mg Oral Q6H PRN Marvon Shillingburg L, NP      ? alum & mag hydroxide-simeth (MAALOX/MYLANTA) 200-200-20 MG/5ML suspension 30 mL  30 mL Oral Q4H PRN Mazzie Brodrick L, NP      ? benztropine (COGENTIN) tablet 0.5 mg  0.5 mg Oral BID Shelda Truby L, NP   0.5 mg at 08/24/21 0945  ? divalproex (DEPAKOTE) DR tablet 250 mg  250 mg Oral Q12H Heyward Douthit L, NP   250 mg at 08/24/21 0945  ? haloperidol (HALDOL) tablet 5 mg  5 mg Oral BID Illana Nolting L, NP    5 mg at 08/24/21 0945  ? hydrOXYzine (ATARAX) tablet 25 mg  25 mg Oral TID PRN Amiria Orrison L, NP      ? magnesium hydroxide (MILK OF MAGNESIA) suspension 30 mL  30 mL Oral Daily PRN Nyela Cortinas L, NP      ? traZODone (DESYREL) tablet 50 mg  50 mg Oral QHS PRN Golden Gilreath L, NP      ? ?Current Outpatient Medications  ?Medication Sig Dispense Refill  ? albuterol (VENTOLIN HFA) 108 (90 Base) MCG/ACT inhaler Inhale 2 puffs into the lungs every 4 (four) hours as needed for wheezing or shortness of breath. (Patient not taking: Reported on 01/03/2021) 18 g 0  ? benztropine (COGENTIN) 0.5 MG tablet Take 1 tablet (0.5 mg  total) by mouth 2 (two) times daily. 60 tablet 0  ? divalproex (DEPAKOTE) 250 MG DR tablet Take 1 tablet (250 mg total) by mouth every 12 (twelve) hours. 60 tablet 0  ? haloperidol (HALDOL) 5 MG tablet Take 1 tablet (5 mg total) by mouth 2 (two) times daily. 60 tablet 0  ? ? ?PTA Medications: (Not in a hospital admission) ? ? ?Musculoskeletal  ?Strength & Muscle Tone: within normal limits ?Gait & Station: normal ?Patient leans: N/A ? ?Psychiatric Specialty Exam  ?Presentation  ?General Appearance: Appropriate for Environment ? ?Eye Contact:Fair ? ?Speech:Clear and Coherent ? ?Speech Volume:Normal ? ?Handedness:Right ? ? ?Mood and Affect  ?Mood:Depressed ? ?Affect:Congruent ? ? ?Thought Process  ?Thought Processes:Coherent; Goal Directed ? ?Descriptions of Associations:Intact ? ?Orientation:Full (Time, Place and Person) ? ?Thought Content:WDL ? Diagnosis of Schizophrenia or Schizoaffective disorder in past: Yes ? Duration of Psychotic Symptoms: Greater than six months ? ? Hallucinations:Hallucinations: None ? ?Ideas of Reference:None ? ?Suicidal Thoughts:Suicidal Thoughts: No ? ?Homicidal Thoughts:Homicidal Thoughts: No ? ? ?Sensorium  ?Memory:Immediate Fair; Remote Fair; Recent Fair ? ?Judgment:Fair ? ?Insight:Fair ? ? ?Executive Functions  ?Concentration:Fair ? ?Attention  Span:Fair ? ?Recall:Fair ? ?Fund of Knowledge:Fair ? ?Language:Fair ? ? ?Psychomotor Activity  ?Psychomotor Activity:Psychomotor Activity: Normal ? ? ?Assets  ?Assets:Communication Skills; Desire for Improvement; Housing; Leisure T

## 2021-08-24 NOTE — Discharge Instructions (Addendum)
Discharge recommendations:  ?Patient is to take medications as prescribed. ?Please see information for follow-up appointment with psychiatry and therapy. ?Please follow up with your primary care provider for all medical related needs.  ? ?Therapy: We recommend that patient participate in individual therapy to address mental health concerns. ? ?Medications: The parent/guardian is to contact a medical professional and/or outpatient provider to address any new side effects that develop. Parent/guardian should update outpatient providers of any new medications and/or medication changes.  ? ?Atypical antipsychotics: If you are prescribed an atypical antipsychotic, it is recommended that your height, weight, BMI, blood pressure, fasting lipid panel, and fasting blood sugar be monitored by your outpatient providers. ? ?Safety:  ?The patient should abstain from use of illicit substances/drugs and abuse of any medications. ?If symptoms worsen or do not continue to improve or if the patient becomes actively suicidal or homicidal then it is recommended that the patient return to the closest hospital emergency department, the [facility], or call 911 for further evaluation and treatment. ?National Suicide Prevention Lifeline 1-800-SUICIDE or 7051234676. ? ?About 988 ?988 offers 24/7 access to trained crisis counselors who can help people experiencing mental health-related distress. People can call or text 988 or chat 988lifeline.org for themselves or if they are worried about a loved one who may need crisis support.  ? ?FOR YOUR BEHAVIORAL HEALTH NEEDS you are advised to follow up with Stephannie Peters, DNP at [facility].  Call them after the holiday weekend to schedule an appointment: ? ?     Stephannie Peters, DNP ?     [facility] ?     [address] ?     [place], [place] [address] ?     [phone] ?

## 2021-08-24 NOTE — ED Provider Notes (Signed)
?  Mario Lloyd, 21 y.o., male patient seen face to face by this provider, consulted with Dr. Earlene Plater; and chart reviewed on 08/24/21.  On evaluation Mario Lloyd is standing at nursing station in no acute distress.  He is alert, oriented x 4, calm, cooperative and attentive.  His mood is euthymic with congruent affect.  He has normal speech, and behavior.  There have been no behavioral outburst or unsafe behaviors during patients observation on continuous assessment unit.  Patietn has responded appropriately with staff and peers.  Patient has taking medications as ordered and reports no adverse reaction.  Patient eating and sleeping without difficulty.  Objectively there is no evidence of psychosis/mania or delusional thinking.  Patient is able to converse coherently, goal directed thoughts, no distractibility, or pre-occupation.  He denies suicidal/self-harm/homicidal ideation, psychosis, and paranoia.  Patient answered question appropriately.    ? ?Rescinding IVC ?After thorough evaluation and review of information currently presented on assessment of Mario Lloyd (respondent), there is insufficient findings to indicate respondent meets criteria for involuntary commitment or require an inpatient level of care.  Respondent is alert/oriented x 4; calm/cooperative; and mood congruent with affect.  Respondent is speaking in a clear tone at moderate volume, and normal pace, with good eye contact.  Respondents' thought process is coherent and relevant; There is no indication that the respondent is currently responding to internal/external stimuli or experiencing delusional thought content; and respondent has denied suicidal/self-harm/homicidal ideation, psychosis, and paranoia.  Respondent has remained calm throughout assessment and has answered questions appropriately.  Currently respondent is not significantly impaired, psychotic, or manic on exam.  A detailed risk assessment has been completed based  on clinical exam and individual risk factors.  There is no evidence of imminent risk to self or others at present and respondent does not meet criteria for psychiatric inpatient admission. ? ?Mario Roseman B. Shanera Meske, NP  ? ? ?

## 2021-09-18 ENCOUNTER — Telehealth (HOSPITAL_COMMUNITY): Payer: Self-pay

## 2021-09-18 NOTE — BH Assessment (Signed)
Care Management - [facility] Follow Up Discharges  ? ?Writer attempted to make contact with patient today and was unsuccessful.  Writer left a HIPPA compliant voice message.  ? ?Per chart review, patient will follow up with his established provider Mario Lloyd at [facility] for medication management ?

## 2021-10-23 ENCOUNTER — Ambulatory Visit (HOSPITAL_COMMUNITY)
Admission: EM | Admit: 2021-10-23 | Discharge: 2021-10-23 | Disposition: A | Discharge status: Home / Self Care | Payer: Medicaid Other | Attending: Nurse Practitioner | Admitting: Nurse Practitioner

## 2021-10-23 DIAGNOSIS — Z5900 Homelessness unspecified: Secondary | ICD-10-CM

## 2021-10-23 DIAGNOSIS — F209 Schizophrenia, unspecified: Secondary | ICD-10-CM

## 2021-10-23 NOTE — ED Provider Notes (Signed)
Behavioral Health Urgent Care Medical Screening Exam  Patient Name: Mario Lloyd MRN: [id number] Date of Evaluation: 10/23/21 Chief Complaint:   Diagnosis:  Final diagnoses:  Homeless  Schizophrenia, unspecified type (HCC)    History of Present illness: Mario Lloyd is a 21 y.o. male who presented for evaluation with complaints of homelessness. The patient reports that he has "nothing really to complain about", and he is already in the system for being here before and they say "I can come here if I don't have a place to go". He reports that he lives in [place] with his father, but "right now I'm homeless as I got kicked out by my father". The patient reports that he has no job, and he is hungry. The patient reports seeing a psychiatrist in the past for depression, and was prescribed four medications, which he is unable to recall the names right now. He reports that he stopped taking his medications two to three weeks ago, and left his meds at his cousins place in [place].   On evaluation, patient is alert, oriented x 4, and cooperative. Speech is slurred and goal directed. Pt appears disheveled with an odor. Eye contact is fleeting. Mood is euthymic, affect is congruent with mood. Thought process is coherent and thought content is WDL.  Pt denies SI/HI/AVH. There is no indication that the patient is responding to internal stimuli. No delusions elicited during this assessment.    Psychiatric Specialty Exam  Presentation  General Appearance:Disheveled  Eye Contact:Fleeting  Speech:Slurred  Speech Volume:Normal  Handedness:Right   Mood and Affect  Mood:Euthymic  Affect:Congruent   Thought Process  Thought Processes:Coherent; Goal Directed  Descriptions of Associations:Intact  Orientation:Full (Time, Place and Person)  Thought Content:WDL  Diagnosis of Schizophrenia or Schizoaffective disorder in past: Yes  Duration of Psychotic Symptoms: Greater than six months   Hallucinations:None "nothing really sometimes I hear sounds that's it." Unable to describe  Ideas of Reference:None  Suicidal Thoughts:No  Homicidal Thoughts:No   Sensorium  Memory:Recent Fair  Judgment:Fair Insight: Barista  Concentration:Poor  Attention Span:Poor  Recall:Poor  Fund of Knowledge:Poor  Language:Poor   Psychomotor Activity  Psychomotor Activity:Normal   Assets  Assets:Physical Health   Sleep  Sleep:Poor  Number of hours: 4  Nutritional Assessment (For OBS and [facility] admissions only) Has the patient had a weight loss or gain of 10 pounds or more in the last 3 months?: No Has the patient had a decrease in food intake/or appetite?: No Does the patient have dental problems?: No Does the patient have eating habits or behaviors that may be indicators of an eating disorder including binging or inducing vomiting?: No Has the patient recently lost weight without trying?: 0 Has the patient been eating poorly because of a decreased appetite?: 0 Malnutrition Screening Tool Score: 0    Physical Exam: Physical Exam Constitutional:      General: He is not in acute distress.    Appearance: Normal appearance. He is not diaphoretic.  HENT:     Head: Normocephalic.     Right Ear: External ear normal.     Left Ear: External ear normal.  Eyes:     General:        Right eye: No discharge.        Left eye: No discharge.  Cardiovascular:     Rate and Rhythm: Normal rate.  Pulmonary:     Effort: No respiratory distress.  Chest:     Chest wall: No tenderness.  Abdominal:     Tenderness: There is no guarding.  Neurological:     Mental Status: He is oriented to person, place, and time.  Psychiatric:        Attention and Perception: He is inattentive. He does not perceive auditory or visual hallucinations.        Mood and Affect: Mood is not anxious or depressed. Affect is not angry or tearful.        Speech: Speech is slurred.         Behavior: Behavior is not agitated or aggressive. Behavior is cooperative.        Thought Content: Thought content is not paranoid or delusional. Thought content does not include homicidal or suicidal ideation. Thought content does not include homicidal or suicidal plan.        Cognition and Memory: Memory normal.   Review of Systems  Constitutional:  Negative for diaphoresis and fever.  HENT:  Negative for congestion and nosebleeds.   Eyes:  Negative for pain and discharge.  Respiratory:  Negative for cough and shortness of breath.   Cardiovascular:  Negative for chest pain and palpitations.  Gastrointestinal:  Negative for diarrhea, nausea and vomiting.  Neurological:  Negative for dizziness, tremors, seizures, weakness and headaches.  Psychiatric/Behavioral:  Positive for substance abuse. Negative for depression, hallucinations and suicidal ideas. The patient is not nervous/anxious.   Blood pressure 125/86, pulse 70, temperature 98.7 F (37.1 C), temperature source Tympanic, resp. rate 18, SpO2 98 %. There is no height or weight on file to calculate BMI.  Musculoskeletal: Strength & Muscle Tone: within normal limits Gait & Station: normal Patient leans: N/A   [facility] MSE Discharge Disposition for Follow up and Recommendations: Based on my evaluation the patient does not appear to have an emergency medical condition and can be discharged with resources and follow up care in outpatient services for Medication Management and Individual Therapy   Saki Legore Royston Bake, NP 10/23/2021, 3:11 AM

## 2021-10-23 NOTE — Progress Notes (Signed)
TRIAGE: ROUTINE   10/23/21 0140  [facility] Triage Screening (Walk-ins at [facility] only)  How Did You Hear About [name]? Self  What Is the Reason for Your Visit/Call Today? Pt states, "I'm in the system for some stuff I was going through when I was in school. I'm havin gheadaches, anxiety, and depression." Pt states he was prescribed medication upon his d/c from [facility] (admission from 01/11/21 - 01/17/21) and that he had been taking the medication until 2-3 weeks ago when he left it at a family member's home. Pt states he sees Science writer for medication management. Pt denies SI, a hx of SI, any prior attempts to kill himself, or a plan to kill himself. Pt was kept overnight at the [facility] 08/23/21 - 08/24/2021 under IVC. Pt denies HI, VH, NSSIB, access to guns/weapons, engagement with the legal system, or SA. Pt states he "hears a lot" of AH.  How Long Has This Been Causing You Problems? 1-6 months  Have You Recently Had Any Thoughts About Hurting Yourself? No  Are You Planning to Commit Suicide/Harm Yourself At This time? No  Have you Recently Had Thoughts About Hurting Someone [name]? No  Are You Planning To Harm Someone At This Time? No  Are you currently experiencing any auditory, visual or other hallucinations? Yes  Please explain the hallucinations you are currently experiencing: Pt states he "hears a lot" of AH.  Have You Used Any Alcohol or Drugs in the Past 24 Hours? No  Do you have any current medical co-morbidities that require immediate attention? No  Clinician description of patient physical appearance/behavior: Pt is dressed in casual, unkempt attire; he is wearing two different socks and has taken his "slides" off of his feet. Pt has an odor. He is able to identify his thoughts, feelings, and concerns and answers the questions posed.  What Do You Feel Would Help You the Most Today? Treatment for Depression or other mood problem;Medication(s)  If access to [facility] was not available, would you have  sought care in the Emergency Department? Yes  Determination of Need Routine (7 days)  Options For Referral Medication Management;Outpatient Therapy

## 2021-10-23 NOTE — Discharge Instructions (Signed)

## 2021-10-30 ENCOUNTER — Ambulatory Visit (HOSPITAL_COMMUNITY)
Admission: EM | Admit: 2021-10-30 | Discharge: 2021-10-30 | Disposition: A | Discharge status: Home / Self Care | Payer: Medicaid Other | Attending: Nurse Practitioner | Admitting: Nurse Practitioner

## 2021-10-30 DIAGNOSIS — F1290 Cannabis use, unspecified, uncomplicated: Secondary | ICD-10-CM | POA: Insufficient documentation

## 2021-10-30 DIAGNOSIS — Z5900 Homelessness unspecified: Secondary | ICD-10-CM | POA: Insufficient documentation

## 2021-10-30 NOTE — ED Notes (Signed)
Pt was not happy with his d/c he stated he needed a bed due to him being homeless. He started fussing stating how can we put him out and dont give a ride. Security was called and walked pt  off property

## 2021-10-30 NOTE — ED Provider Notes (Signed)
Behavioral Health Urgent Care Medical Screening Exam  Patient Name: Mario Lloyd MRN: [id number] Date of Evaluation: 10/30/21 Chief Complaint: " I'm kinda homeless right now".   Diagnosis:  Final diagnoses:  Homelessness  Cannabis use disorder    History of Present illness: Mario Lloyd is a 21 y.o. male who reports his father dropped him off at the facility tonight, and he is not sure why he is here. He reports " I'm kinda homeless right now, and my dad dropped me off". He adds " This has been happening for a while, same thing daily". " I stay outside, I don't do nothing". The patient reports he used to take medications for depression and anxiety, but "not for as long time now". The patient reports he purchases and smokes marijuana a couple of time daily. The patient denies SI/HI/AVH.   On evaluation, patient is alert, oriented x 3, and cooperative. Speech is slow. Pt appears disheveled with an odor. Eye contact is minimal. Mood is euthymic, affect is congruent with mood. Thought process and thought content is coherent. Pt denies SI/HI/AVH. There is no indication that the patient is responding to internal stimuli. No delusions elicited during this assessment.   Psychiatric Specialty Exam  Presentation  General Appearance:Disheveled  Eye Contact:Minimal  Speech:Slow  Speech Volume:Normal  Handedness:Right   Mood and Affect  Mood:Euthymic  Affect:Congruent   Thought Process  Thought Processes:Coherent  Descriptions of Associations:Intact  Orientation:Full (Time, Place and Person)  Thought Content:WDL  Diagnosis of Schizophrenia or Schizoaffective disorder in past: Yes  Duration of Psychotic Symptoms: Greater than six months  Hallucinations:None "nothing really sometimes I hear sounds that's it." Unable to describe  Ideas of Reference:None  Suicidal Thoughts:No  Homicidal Thoughts:No   Sensorium  Memory:Recent  Fair  Judgment:Intact  Insight:Fair   Executive Functions  Concentration:Poor  Attention Span:Poor  Recall:Poor  Fund of Knowledge:Fair  Language:Poor   Psychomotor Activity  Psychomotor Activity:Normal   Assets  Assets:Communication Skills   Sleep  Sleep:Fair  Number of hours: 4   Nutritional Assessment (For OBS and [facility] admissions only) Has the patient had a weight loss or gain of 10 pounds or more in the last 3 months?: No Has the patient had a decrease in food intake/or appetite?: No Does the patient have dental problems?: No Does the patient have eating habits or behaviors that may be indicators of an eating disorder including binging or inducing vomiting?: No Has the patient recently lost weight without trying?: 0 Has the patient been eating poorly because of a decreased appetite?: 0 Malnutrition Screening Tool Score: 0    Physical Exam: Physical Exam Constitutional:      General: He is not in acute distress.    Appearance: He is not diaphoretic.  HENT:     Head: Normocephalic.     Right Ear: External ear normal.     Left Ear: External ear normal.     Nose: No congestion.  Eyes:     General:        Right eye: No discharge.        Left eye: No discharge.  Cardiovascular:     Rate and Rhythm: Normal rate.  Pulmonary:     Effort: No respiratory distress.  Chest:     Chest wall: No tenderness.  Neurological:     Mental Status: He is oriented to person, place, and time.  Psychiatric:        Attention and Perception: He is inattentive. He does not perceive  auditory or visual hallucinations.        Mood and Affect: Mood normal.        Speech: Speech normal.        Behavior: Behavior is slowed. Behavior is cooperative.        Thought Content: Thought content is not paranoid or delusional. Thought content does not include homicidal or suicidal ideation. Thought content does not include homicidal or suicidal plan.        Cognition and Memory:  Cognition normal.        Judgment: Judgment is not impulsive.    Review of Systems  Constitutional:  Negative for diaphoresis and fever.  HENT:  Negative for congestion.   Eyes:  Negative for discharge.  Respiratory:  Negative for cough and shortness of breath.   Cardiovascular:  Negative for chest pain and palpitations.  Gastrointestinal:  Negative for diarrhea and vomiting.  Neurological:  Negative for seizures, loss of consciousness, weakness and headaches.  Psychiatric/Behavioral:  Positive for substance abuse. Negative for depression, hallucinations and suicidal ideas. The patient is not nervous/anxious and does not have insomnia.    Blood pressure 121/87, pulse 69, temperature 97.7 F (36.5 C), temperature source Oral, resp. rate 20, SpO2 98 %. There is no height or weight on file to calculate BMI.  Musculoskeletal: Strength & Muscle Tone: within normal limits Gait & Station: normal Patient leans: N/A   [facility] MSE Discharge Disposition for Follow up and Recommendations: Based on my evaluation the patient does not appear to have an emergency medical condition and can be discharged with resources and follow up care in outpatient services for substance abuse treatment.    Mancel Bale, NP 10/30/2021, 1:36 AM

## 2021-10-30 NOTE — Discharge Instructions (Addendum)
   _________________________________________________________ Assessment/Therapy Walk-Ins will be available on Monday and Wednesday's Only  Monday/Wednesday: 8 AM until slots are full  For Monday to Wednesday, it is recommended that patients arrive between 7:30 AM and 7:45 AM because patients will be seen in the order of arrival.  Go to the second floor on arrival and check in.  **Availability is limited; therefore, patients may not be seen on the same day.**  Psychiatry/Medication Management Walk-Ins will be available on Monday, Wednesday, Thursday and Friday  Monday/Wednesday/Thursday/Friday: 8 AM to 11 AM.  It is recommended that patients arrive by 7:30 AM to 7:45 AM because patients will be seen in the order of arrival.  Go to the second floor on arrival and check in.  **Availability is limited; therefore, patients may not be seen on the same day.**

## 2021-10-30 NOTE — ED Triage Notes (Signed)
Pt presents to [facility] voluntarily, unaccompanied at this time with complaints of being homeless and not sure why he was brought in. Per, front desk registration the pt's father walked him inside the building and stated that the pt had not had medication in 3 months and he was done and immediately left. Pt reports that he was at his father's home earlier to get some belongings but does not know why he was brought in and states" I know something is off and I need to reconstruct my mind". Pt is prescribed Citalpram 10mg  and Abilify 400mg  and pt is unsure of the last time he took it. Pt has hx of schizophrenia and was last seen at [facility] on 10/23/21 for anxiety and depression. Pt denies SI, HI and substance/alcohol use.

## 2021-11-14 ENCOUNTER — Ambulatory Visit (HOSPITAL_COMMUNITY)
Admission: EM | Admit: 2021-11-14 | Discharge: 2021-11-14 | Disposition: A | Discharge status: Home / Self Care | Payer: Medicaid Other | Attending: Psychiatry | Admitting: Psychiatry

## 2021-11-14 ENCOUNTER — Ambulatory Visit (HOSPITAL_COMMUNITY)
Admission: EM | Admit: 2021-11-14 | Discharge: 2021-11-14 | Disposition: A | Discharge status: Home / Self Care | Payer: Medicaid Other | Source: Home / Self Care

## 2021-11-14 ENCOUNTER — Telehealth (HOSPITAL_COMMUNITY): Payer: Self-pay

## 2021-11-14 DIAGNOSIS — F209 Schizophrenia, unspecified: Secondary | ICD-10-CM | POA: Insufficient documentation

## 2021-11-14 DIAGNOSIS — Z91148 Patient's other noncompliance with medication regimen for other reason: Secondary | ICD-10-CM | POA: Insufficient documentation

## 2021-11-14 DIAGNOSIS — F1290 Cannabis use, unspecified, uncomplicated: Secondary | ICD-10-CM | POA: Insufficient documentation

## 2021-11-14 DIAGNOSIS — R450 Nervousness: Secondary | ICD-10-CM | POA: Insufficient documentation

## 2021-11-14 DIAGNOSIS — Z008 Encounter for other general examination: Secondary | ICD-10-CM | POA: Insufficient documentation

## 2021-11-14 DIAGNOSIS — Z597 Insufficient social insurance and welfare support: Secondary | ICD-10-CM | POA: Insufficient documentation

## 2021-11-14 DIAGNOSIS — Z5900 Homelessness unspecified: Secondary | ICD-10-CM | POA: Insufficient documentation

## 2021-11-14 DIAGNOSIS — R4589 Other symptoms and signs involving emotional state: Secondary | ICD-10-CM

## 2021-11-14 DIAGNOSIS — Z5982 Transportation insecurity: Secondary | ICD-10-CM | POA: Insufficient documentation

## 2021-11-14 DIAGNOSIS — Z733 Stress, not elsewhere classified: Secondary | ICD-10-CM | POA: Insufficient documentation

## 2021-11-14 DIAGNOSIS — Z638 Other specified problems related to primary support group: Secondary | ICD-10-CM | POA: Insufficient documentation

## 2021-11-14 DIAGNOSIS — Z789 Other specified health status: Secondary | ICD-10-CM

## 2021-11-14 DIAGNOSIS — Z79899 Other long term (current) drug therapy: Secondary | ICD-10-CM | POA: Insufficient documentation

## 2021-11-14 DIAGNOSIS — Z560 Unemployment, unspecified: Secondary | ICD-10-CM | POA: Insufficient documentation

## 2021-11-14 DIAGNOSIS — F32A Depression, unspecified: Secondary | ICD-10-CM | POA: Insufficient documentation

## 2021-11-14 NOTE — Discharge Instructions (Signed)
F/u with outpatient psychaitry or Walk-in clinic

## 2021-11-14 NOTE — ED Notes (Signed)
Patient discharged by provider with written and verbal instructions. 

## 2021-11-14 NOTE — ED Provider Notes (Signed)
Behavioral Health Urgent Care Medical Screening Exam  Patient Name: Mario Lloyd MRN: [id number] Date of Evaluation: 11/14/21 Chief Complaint:   Diagnosis:  Final diagnoses:  Homelessness  Anxious appearance    History of Present illness: Mario Lloyd is a 21 y.o. male.  With a history of schizophrenia, homelessness and medication noncompliance.  Presented to [facility] by [facility].  Per the patient nothing is going well or good him trying to talk with the therapist to clear my head.  Per the patient he is seeing a neuropsychiatry, but did not know the place where he sees the psychiatrist.  Patient reported he last took his medication yesterday when asked where his his medicines and outpatient stated they are at his father's house.  Patient reports that he is homeless.  Per the patient his family lives in the [place] area, but they kicked him out.   Face-to-face observation of patient, he is alert and oriented x4, speech is clear but can be incoherent at times.  Mood is anxious affect is congruent with mood.  Patient denies SI, HI, AVH, or paranoia.  Patient reports that he used marijuana daily, denies alcohol use, denies any other illicit drug use.  When asked if he can keep himself safe patient says yes.  Recommend discharge with resources for patient to follow up walk-in clinic.  Psychiatric Specialty Exam  Presentation  General Appearance:Disheveled  Eye Contact:Fair  Speech:Clear and Coherent  Speech Volume:Normal  Handedness:Ambidextrous   Mood and Affect  Mood:Euphoric  Affect:Congruent   Thought Process  Thought Processes:Linear  Descriptions of Associations:Intact  Orientation:Full (Time, Place and Person)  Thought Content:WDL  Diagnosis of Schizophrenia or Schizoaffective disorder in past: Yes  Duration of Psychotic Symptoms: Greater than six months  Hallucinations:None "nothing really sometimes I hear sounds that's it." Unable to  describe  Ideas of Reference:None  Suicidal Thoughts:No  Homicidal Thoughts:No   Sensorium  Memory:Immediate Fair  Judgment:Intact  Insight:Fair   Executive Functions  Concentration:Fair  Attention Span:Fair  Recall:Poor  Fund of Knowledge:Fair  Language:Poor   Psychomotor Activity  Psychomotor Activity:Normal   Assets  Assets:Desire for Improvement; Housing   Sleep  Sleep:Fair  Number of hours: 4   Nutritional Assessment (For OBS and [facility] admissions only) Has the patient had a weight loss or gain of 10 pounds or more in the last 3 months?: No Has the patient had a decrease in food intake/or appetite?: No Does the patient have dental problems?: No Does the patient have eating habits or behaviors that may be indicators of an eating disorder including binging or inducing vomiting?: No Has the patient recently lost weight without trying?: 0    Physical Exam: Physical Exam HENT:     Head: Normocephalic.     Nose: Nose normal.  Cardiovascular:     Rate and Rhythm: Normal rate.  Pulmonary:     Effort: Pulmonary effort is normal.  Musculoskeletal:        General: Normal range of motion.     Cervical back: Normal range of motion.  Skin:    General: Skin is warm.  Neurological:     General: No focal deficit present.     Mental Status: He is alert.  Psychiatric:        Mood and Affect: Mood normal.        Behavior: Behavior normal.        Thought Content: Thought content normal.        Judgment: Judgment normal.  Review of Systems  Constitutional: Negative.   HENT: Negative.    Eyes: Negative.   Respiratory: Negative.    Cardiovascular: Negative.   Gastrointestinal: Negative.   Genitourinary: Negative.   Musculoskeletal: Negative.   Skin: Negative.   Neurological: Negative.   Endo/Heme/Allergies: Negative.   Psychiatric/Behavioral:  Positive for depression. The patient is nervous/anxious.    Blood pressure (!) 123/96, pulse 66,  temperature 97.8 F (36.6 C), temperature source Oral, resp. rate 18, SpO2 100 %. There is no height or weight on file to calculate BMI.  Musculoskeletal: Strength & Muscle Tone: within normal limits Gait & Station: normal Patient leans: N/A   [facility] MSE Discharge Disposition for Follow up and Recommendations: Based on my evaluation the patient does not appear to have an emergency medical condition and can be discharged with resources and follow up care in outpatient services for Medication Management and Individual Therapy   Sindy Guadeloupe, NP 11/14/2021, 4:14 AM

## 2021-11-14 NOTE — ED Provider Notes (Signed)
Behavioral Health Urgent Care Medical Screening Exam  Patient Name: Mario Lloyd MRN: [id number] Date of Evaluation: 11/14/21 Chief Complaint:   Diagnosis:  Final diagnoses:  Need for community resource   History of Present illness: Mario Lloyd is a 21 y.o. male. Pt presents voluntarily to [facility] behavioral health for walk-in assessment.  Pt is assessed face-to-face by nurse practitioner.   Pt reports he is presenting today due to seeking resources of medication management.   Pt endorses current euthymic, "alright" mood. He reports good appetite, eating 3 meals/day, and sleep, sleeping 9 hours/night. Pt reports he has been homeless for the past 2 to 3 weeks following argument w/ his father about getting a job (pt states he has been living with his father prior to this argument). Reports he has been on disability since March 2023, although has not yet received a check. States his father is helping him follow up with this. He reports he is also seeking employment. States he last worked in 2022 "at [employer]", although stopped working due to lack of transportation. He reports use of marijuana "when I'm able to", which he estimates to be about 3 to 4 times a week. He states he last used marijuana yesterday. He denies use of alcohol, crack/cocaine, other substances. He denies SI/VI/HI, AVH, paranoia. He denies hx of NSSI, SA. He denies access to a firearm or other weapons. Pt states he was connected w/ therapy w/ Ms. Aggie Cosier at [facility], although has not seen her in 1 to 2 months.   Discussed recommendation for follow up with outpatient medication management and therapy. Pt agrees w/ plan. Pt provided w/ resources upon discharge. Safety planning completed, should condition worsen, pt should call 911/EMS or go to the nearest emergency room for evaluation. Pt verbalized understanding.  Pt is a&ox3, in no acute distress, non-toxic appearing. He appears casually dressed, although  disheveled. Eye contact is fair. Speech is clear and coherent, w/ slowed rate, normal volume. Reported mood is euthymic. Affect is blunt. TP is coherent, linear, goal directed. Description of associations is intact. TC is logical. There is no evidence of internal preoccupation, agitation, aggression or distractibility. No delusions or paranoia elicited. Pt is calm, cooperative, pleasant.  Psychiatric Specialty Exam  Presentation  General Appearance:Disheveled; Casual  Eye Contact:Fair  Speech:Clear and Coherent; Normal Rate  Speech Volume:Normal  Handedness:   Mood and Affect  Mood:Euthymic  Affect:Blunt   Thought Process  Thought Processes:Coherent; Linear; Goal Directed  Descriptions of Associations:Intact  Orientation:Full (Time, Place and Person)  Thought Content:Logical  Diagnosis of Schizophrenia or Schizoaffective disorder in past: Yes  Duration of Psychotic Symptoms: Greater than six months  Hallucinations:None  Ideas of Reference:None  Suicidal Thoughts:No  Homicidal Thoughts:No   Sensorium  Memory:Immediate Fair; Recent Fair  Judgment:Intact  Insight:Fair   Executive Functions  Concentration:Fair  Attention Span:Fair  Recall:Fair  Fund of Knowledge:Fair  Language:Fair   Psychomotor Activity  Psychomotor Activity:Normal   Assets  Assets:Desire for Improvement; Manufacturing systems engineer; Financial Resources/Insurance; Leisure Time   Sleep  Sleep:Good  Number of hours: 9   Nutritional Assessment (For OBS and FBC admissions only) Has the patient had a weight loss or gain of 10 pounds or more in the last 3 months?: No Has the patient had a decrease in food intake/or appetite?: No Does the patient have dental problems?: No Does the patient have eating habits or behaviors that may be indicators of an eating disorder including binging or inducing vomiting?: No Has the  patient recently lost weight without trying?: 0    Physical  Exam: Physical Exam Cardiovascular:     Rate and Rhythm: Normal rate.  Pulmonary:     Effort: Pulmonary effort is normal.  Neurological:     Mental Status: He is alert and oriented to person, place, and time.  Psychiatric:        Attention and Perception: Attention and perception normal.        Mood and Affect: Mood normal. Affect is blunt.        Speech: Speech normal.        Behavior: Behavior normal. Behavior is cooperative.        Thought Content: Thought content normal.    Review of Systems  Constitutional:  Negative for chills and fever.  Respiratory:  Negative for shortness of breath.   Cardiovascular:  Negative for chest pain and palpitations.  Gastrointestinal:  Negative for abdominal pain.  Neurological:  Negative for dizziness and headaches.  Psychiatric/Behavioral: Negative.     Blood pressure 127/79, pulse 61, temperature 98.3 F (36.8 C), temperature source Oral, resp. rate 17, SpO2 98 %. There is no height or weight on file to calculate BMI.  Musculoskeletal: Strength & Muscle Tone: within normal limits Gait & Station: normal Patient leans: N/A   BHUC MSE Discharge Disposition for Follow up and Recommendations: Based on my evaluation the patient does not appear to have an emergency medical condition and can be discharged with resources and follow up care in outpatient services for Medication Management and Individual Therapy   Lauree Chandler, NP 11/14/2021, 3:43 PM

## 2021-11-14 NOTE — ED Notes (Signed)
[employer] transport requested. 

## 2021-11-14 NOTE — Discharge Instructions (Addendum)
Based on your needs and the presenting issue, resources for outpatient services with therapy and medication management are provided below.  Your current medication provider will be listed tomorrow.  It is imperative that you follow through with treatment recommendations to mitigate further risk to your safety and mental well-being. In case of an urgent emergency, you have the option of contacting the Mobile Crisis Unit with [facility] at [phone].   You have the option to call 211 for assistance in finding additional mental health agencies if these do not meet your needs.   For your behavioral health needs you are advised to follow up with [facility] at your earliest opportunity:            [facility]      [address] SECOND FLOOR      [place], [place] [address]      [phone]       They offer psychiatry/medication management and therapy.  New patients are seen in their walk-in clinic.  Walk-in hours are Monday, Wednesday, Thursday and Friday from 8:00 am - 11:00 am for psychiatry, and Monday and Wednesday from 8:00 am - 11:00 am for therapy.  Walk-in patients are seen on a first come, first served basis, so try to arrive as early as possible for the best chance of being seen the same day.  BE SURE TO TAKE THE ELEVATOR TO THE SECOND FLOOR.  Please note that to be eligible for services you must bring an ID or a piece of mail with your name and a [facility] address.          [facility] [place]      [address]      [place], [place] [address]      [phone]        [facility]      [address] [place], [place], [address]      [phone] phone      [phone] fax        [facility]      [address]      [place], [place], [address]      [phone] phone to set up initial appointment       [facility]     [address]     [place], [place], [address]              [facility] [place]      [address]      [place], [place] [address]      [phone]       [facility]      [address]      [place], [place] [address]      [phone]       [facility]      [address] [place]      [place], [place] [address]      [phone]       [facility]      [address]      [place], [place] [address]      [phone]       [facility]      [address]  [place], [place] [address]      [phone]      Archer Asa, MD specializes in geropsych)     SHELTERS   [facility] [address], [place], [place] [address] [phone] Population served: Adult men & women (80 years old and older, able to perform activities for daily living) Documents required: Valid ID & Social Security Card  [facility] [address], [place], [place] [address] [phone] Population served: Males 18+ Documents required: Valid ID & Social Security Card  [facility] [address] [place], [place] [address] [phone] Population served: Single adults and families with children Documents required: Valid ID & another form of identification  [facility] [employer] [address] [place], [place] [address] [phone] Population Served: Families with children, adult women, and adult men.  [facility] [address] [place], [place] [address] [phone] Population served: Men 18+, preference for disabled and/or veterans Eligibility: By referral only

## 2021-11-14 NOTE — Progress Notes (Signed)
   11/14/21 0401  BH[facility] Triage Screening (Walk-ins at [facility] only)  How Did You Hear About [name]? Self  What Is the Reason for Your Visit/Call Today? Griselda Tosh is a 21 year old male presenting voluntary to [facility] requesting therapy and medication management services. Patient denied SI, HI, psychosis and alcohol usage. Patient reported smoking marijuana 30 minutes ago. Patient reported being homeless. Patient reported worsening depressive symptoms. Patient stated "I don't feel I can maneuver". Patient reported seeing Crystal Montago for medication management, last seen 3 months ago and that his prescription bottle is at his fathers house. Patient reported he is not taking medications as prescribed. Patient contracts for safety.  How Long Has This Been Causing You Problems? <Week  Have You Recently Had Any Thoughts About Hurting Yourself? No  Are You Planning to Commit Suicide/Harm Yourself At This time? No  Have you Recently Had Thoughts About Hurting Someone [name]? No  Are You Planning To Harm Someone At This Time? No  Are you currently experiencing any auditory, visual or other hallucinations? No  Have You Used Any Alcohol or Drugs in the Past 24 Hours? Yes  How long ago did you use Drugs or Alcohol? marijuana  What Did You Use and How Much? 30 minutes ago, amount unknown  Do you have any current medical co-morbidities that require immediate attention? No  Clinician description of patient physical appearance/behavior: disheveled / cooperative  What Do You Feel Would Help You the Most Today? Treatment for Depression or other mood problem  If access to [facility] was not available, would you have sought care in the Emergency Department? Yes  Determination of Need Routine (7 days)  Options For Referral Outpatient Therapy;Medication Management

## 2021-11-14 NOTE — BH Assessment (Signed)
Comprehensive Clinical Assessment (CCA) Screening, Triage and Referral Note  11/14/2021 Mario Lloyd [id number]  Triage/Screening complete. Patient is noted as Urgent. Disposition pending the providers assessment.   Chief Complaint:  Chief Complaint  Patient presents with   Psychiatric Evaluation   Visit Diagnosis: Schizophrenia   Patient Reported Information How did you hear about [name]? Family/Friend  What Is the Reason for Your Visit/Call Today? Patient is Routine. Mario Lloyd is a  21 y/o, male. Dx's with Schizophrenia. He presents to the [facility] for the second time today. He is  voluntary. States that he was transported by his dad.  Patient reports that he is here to "Make myself better". Denies that he has suicidal ideations. No prior suicide attempts and/or gestures. Denies history of self injurious behaviors. Denies homicidal ideations. He reports chronic auditory hallucinations since the age of 21 years old. Last experienced auditory hallucinations March 2023. Currently experiencing visual hallucinations of "Brightness, big vision of nothing, tunnels, but it's something". Denies alcohol and drug use. However, his assessment from earlier notes that he has a history of THC.  History of multiple inpatient psychiatric hospitalizations. Last inpatient psychiatric admission noted in Epic was 01/11/2021 at [facility]. Patient is non compliant with psychiatric medications and currently homeless.  How Long Has This Been Causing You Problems? > than 6 months  What Do You Feel Would Help You the Most Today? Treatment for Depression or other mood problem; Medication(s); Social Support; Patent examiner; Food Assistance; Financial Resources   Have You Recently Had Any Thoughts About Hurting Yourself? No  Are You Planning to Commit Suicide/Harm Yourself At This time? No   Have you Recently Had Thoughts About Hurting Someone [name]? No  Are You Planning to Harm  Someone at This Time? No  Explanation: No data recorded  Have You Used Any Alcohol or Drugs in the Past 24 Hours? No  How Long Ago Did You Use Drugs or Alcohol? No data recorded What Did You Use and How Much? Patient denies substance use   Do You Currently Have a Therapist/Psychiatrist? No  Name of Therapist/Psychiatrist: Mrs. Aggie Cosier   Have You Been Recently Discharged From Any Office Practice or Programs? No  Explanation of Discharge From Practice/Program: No data recorded   CCA Screening Triage Referral Assessment Type of Contact: Face-to-Face  Telemedicine Service Delivery:   Is this Initial or Reassessment? Initial Assessment  Date Telepsych consult ordered in [facility]:  08/22/21  Time Telepsych consult ordered in [facility]:  1026  [facility] of Assessment[facility]  Provider Location: [facility] Services   Collateral Involvement: [place], father, [phone], petitioner   Does Patient Have a Court Appointed Legal Guardian? No data recorded Name and Contact of Legal Guardian: No data recorded If Minor and Not Living with Parent(s), Who has Custody? No data recorded Is CPS involved or ever been involved? In the Past  Is APS involved or ever been involved? Never   Patient Determined To Be At Risk for Harm To Self or Others Based on Review of Patient Reported Information or Presenting Complaint? No  Method: No data recorded Availability of Means: No data recorded Intent: No data recorded Notification Required: No data recorded Additional Information for Danger to Others Potential: No data recorded Additional Comments for Danger to Others Potential: No data recorded Are There Guns or Other Weapons in Your Home? No data recorded Types of Guns/Weapons: No data recorded Are These Weapons Safely Secured?  No data recorded Who Could Verify You Are Able To Have These Secured: No data recorded Do You Have any Outstanding Charges, Pending Court  Dates, Parole/Probation? No data recorded Contacted To Inform of Risk of Harm To Self or Others: No data recorded  Does Patient Present under Involuntary Commitment? Yes  IVC Papers Initial File Date: 08/22/21   [place] of Residence: [place]   Patient Currently Receiving the Following Services: Medication Management ([name], NP at [facility])   Determination of Need: Routine (7 days)   Options For Referral: Medication Management   Discharge Disposition:     Melynda Ripple, Counselor

## 2021-11-14 NOTE — BH Assessment (Signed)
Care Management - [facility]UC Follow Up Discharges   Writer attempted to make contact with patient today and was unsuccessful.  Writer left a HIPPA compliant voice message.   Per chart review, patient will follow up with his established provider [name], NP.

## 2021-11-14 NOTE — BH Assessment (Signed)
LCSW Progress Note   Per Kelle Darting, NP, this pt does not require psychiatric hospitalization at this time.  Pt is psychiatrically cleared.  Discharge instructions include several resources for outpatient services for therapy and medication management as well as shelters.  EDP Kelle Darting, NP, has been notified.  Hansel Starling, MSW, LCSW [facility] [phone] or [phone]

## 2021-11-18 ENCOUNTER — Ambulatory Visit (HOSPITAL_COMMUNITY)
Admission: EM | Admit: 2021-11-18 | Discharge: 2021-11-18 | Disposition: A | Discharge status: Home / Self Care | Payer: Medicaid Other | Attending: Urology | Admitting: Urology

## 2021-11-18 DIAGNOSIS — Z5900 Homelessness unspecified: Secondary | ICD-10-CM | POA: Insufficient documentation

## 2021-11-18 DIAGNOSIS — F209 Schizophrenia, unspecified: Secondary | ICD-10-CM | POA: Insufficient documentation

## 2021-11-18 NOTE — ED Triage Notes (Signed)
Pt presents to [facility] voluntarily, unacommpanied at this time with complaint of not having a place to stay tonight. Pt has hx of schizophrenia with last visit to [facility] on 11/14/21. Pt reports coming in tonight because he needs "to get his mind right for tomorrow". Pt reports that he is supposed to begin residing with a cousin that lives in [place] starting tomorrow. Pt was calm and cooperative during triage process. Pt does endorse having visual hallucinations that he cannot explain. Pt says he sometimes sees split visions, but could not elaborate specifically on what he is seeing. Pt denies SI, HI, substance/alcohol use.

## 2021-11-18 NOTE — Discharge Instructions (Addendum)

## 2021-11-18 NOTE — ED Notes (Signed)
Patient given AVS, verbalizes understanding - escorted out to lobby with no sxs of distress, belongings returned

## 2021-11-18 NOTE — ED Provider Notes (Addendum)
Behavioral Health Urgent Care Medical Screening Exam  Patient Name: Mario Lloyd MRN: [id number] Date of Evaluation: 11/19/21 Chief Complaint:   Diagnosis:  Final diagnoses:  Homelessness    History of Present illness: Mario Lloyd is a 21 y.o. male with psychiatric history of depression, schizophrenia, cannabis use disorder, and homelessness.  Patient presented voluntarily to [facility] requesting assistance with with shelter.  Patient was seen face-to-face and his chart was reviewed by this nurse practitioner.  On assessment, patient is disheveled, alert, and oriented x4.  He is calm and cooperative.  He is speaking in normal tone of voice at moderate rate with good eye contact.  Patient's mood is euthymic with congruent affect.  No evidence of distractibility, preoccupation, mania, delusional thought content noted during assessment.    Patient reports that he is currently homeless.  He says his father kicked him out of the home after an argument approximately 2 weeks ago.  He reports that he came to [facility] because " I need a place to stay for 1 night; so I can lay down and clear my head."  He says he does not have any medical/psychiatric complaints.  He reports that he is able to contract for safety and denies suicidal ideation, homicidal ideation, paranoia, and hallucination.  He reports that he smokes marijuana 2 to 3 days/week.  He denies all other substance use.   Psychiatric Specialty Exam  Presentation  General Appearance:Disheveled  Eye Contact:Good  Speech:Clear and Coherent  Speech Volume:Normal  Handedness:Right   Mood and Affect  Mood:Euthymic  Affect:Congruent   Thought Process  Thought Processes:Coherent; Linear; Goal Directed  Descriptions of Associations:Intact  Orientation:Full (Time, Place and Person)  Thought Content:WDL  Diagnosis of Schizophrenia or Schizoaffective disorder in past: Yes  Duration of Psychotic Symptoms: Greater than six months   Hallucinations:None "nothing really sometimes I hear sounds that's it." Unable to describe  Ideas of Reference:None  Suicidal Thoughts:No  Homicidal Thoughts:No   Sensorium  Memory:Immediate Good; Recent Fair; Remote Fair  Judgment:Good  Insight:Fair   Executive Functions  Concentration:Good  Attention Span:Good  Recall:Fair  [employer] of Knowledge:Fair  Language:Good   Psychomotor Activity  Psychomotor Activity:Normal   Assets  Assets:Physical Health; Manufacturing systems engineer; Desire for Improvement   Sleep  Sleep:Good  Number of hours: 7   No data recorded  Physical Exam: Physical Exam Vitals and nursing note reviewed.  Constitutional:      General: He is not in acute distress.    Appearance: He is well-developed.  HENT:     Head: Normocephalic and atraumatic.  Eyes:     Conjunctiva/sclera: Conjunctivae normal.  Cardiovascular:     Rate and Rhythm: Bradycardia present.  Pulmonary:     Effort: Pulmonary effort is normal. No respiratory distress.     Breath sounds: Normal breath sounds.  Abdominal:     Palpations: Abdomen is soft.     Tenderness: There is no abdominal tenderness.  Musculoskeletal:        General: No swelling.     Cervical back: Normal range of motion.  Skin:    General: Skin is dry.     Capillary Refill: Capillary refill takes less than 2 seconds.     Findings: Rash present.  Neurological:     Mental Status: He is alert and oriented to person, place, and time.  Psychiatric:        Attention and Perception: Attention and perception normal.        Mood and Affect: Mood normal.  Speech: Speech normal.        Behavior: Behavior normal. Behavior is cooperative.        Thought Content: Thought content normal.        Cognition and Memory: Cognition normal.    Review of Systems  Constitutional: Negative.   HENT: Negative.    Eyes: Negative.   Respiratory: Negative.    Cardiovascular: Negative.   Gastrointestinal: Negative.    Genitourinary: Negative.   Musculoskeletal: Negative.   Skin: Negative.   Neurological: Negative.   Endo/Heme/Allergies: Negative.   Psychiatric/Behavioral:  Positive for substance abuse.    Blood pressure 113/67, pulse (!) 57, temperature 98.7 F (37.1 C), temperature source Oral, resp. rate 18, SpO2 98 %. There is no height or weight on file to calculate BMI.  Musculoskeletal: Strength & Muscle Tone: within normal limits Gait & Station: normal Patient leans: Right   BHUC MSE Discharge Disposition for Follow up and Recommendations: Based on my evaluation the patient does not appear to have an emergency medical condition and can be discharged with resources and follow up care in outpatient services for Medication Management and Individual Therapy  -Resources for area shelters provided to patient.  No evidence of imminent danger to self or others at this time. Patient does not meet criteria for psychiatric admission or IVC. Supportive therapy provided about ongoing stressors. Discussed crisis plan, callling 911/988 or going to Emergency Dept    Maricela Bo, NP 11/19/2021, 12:32 AM

## 2021-11-20 ENCOUNTER — Ambulatory Visit (HOSPITAL_COMMUNITY)
Admission: EM | Admit: 2021-11-20 | Discharge: 2021-11-20 | Disposition: A | Discharge status: Home / Self Care | Payer: Medicaid Other | Attending: Nurse Practitioner | Admitting: Nurse Practitioner

## 2021-11-20 DIAGNOSIS — F1290 Cannabis use, unspecified, uncomplicated: Secondary | ICD-10-CM | POA: Insufficient documentation

## 2021-11-20 DIAGNOSIS — Z5900 Homelessness unspecified: Secondary | ICD-10-CM | POA: Insufficient documentation

## 2021-11-20 NOTE — ED Notes (Addendum)
Pt given AVS and all questions answered fully. Pt discharged, belongings returned, and walked out.

## 2021-11-20 NOTE — Discharge Instructions (Addendum)

## 2021-11-20 NOTE — ED Triage Notes (Signed)
Pt presents to [facility] voluntarily, and states " I'm just down right now". Pt reports being out of medications and he needs to be seen. Pt reports he is homeless and was supposed to go to his cousin's home, but did not have a ride.  Pt states he is hearing voices, but unable to articulate what he is hearing. Pt was at [facility] on 11/19/21 with similar presentation. Pt has used marijuana earlier tonight. (unsure of amount) Pt denies SI, HI.

## 2021-11-20 NOTE — ED Provider Notes (Signed)
Behavioral Health Urgent Care Medical Screening Exam  Patient Name: Mario Lloyd MRN: [id number] Date of Evaluation: 11/20/21 Chief Complaint:  " I live nowhere".  Diagnosis:  Final diagnoses:  Homelessness  Cannabis use disorder    History of Present illness: Mario Lloyd is a 21 y.o. male with psychiatric history of "depression, schizophrenia, cannabis use disorder and homelessness".  The patient presented voluntarily to the [facility] with complaints of homelessness.  When asked how we can help him tonight, the patient states "nothing much", "I don't know". Pt states "I'm really trying to do everything in order to survive, I'm trying to think what's stopping me from excelling. Pt denies drinking alcohol. Pt endorses use of marijuana and states " I smoke 1-2 blunts daily". Pt reports that the last time he was here, he went back to where he was staying on [address]. Pt states " I live nowhere". " I'm still trying to get with my dad". When the patient was asked if he followed up with the information for homeless shelters from his last urgent care visit, he stated " I don't know, I didn't, It don't matter". Pt denies SI/HI/AVH.   On evaluation, patient is alert, oriented x 3, and cooperative. Speech is clear and coherent. Pt appears well groomed. Eye contact is fair. Mood is euthymic, affect is congruent with mood. Thought process and thought content is coherent. Pt denies SI/HI/AVH. There is no indication that the patient is responding to internal stimuli. No delusions elicited during this assessment.    Psychiatric Specialty Exam  Presentation  General Appearance:Disheveled  Eye Contact:Fair  Speech:Clear and Coherent  Speech Volume:Normal  Handedness:Right   Mood and Affect  Mood:Euthymic  Affect:Congruent   Thought Process  Thought Processes:Coherent  Descriptions of Associations:Intact  Orientation:Partial  Thought Content:WDL  Diagnosis of Schizophrenia or  Schizoaffective disorder in past: Yes  Duration of Psychotic Symptoms: Greater than six months  Hallucinations:Other (comment) "nothing really sometimes I hear sounds that's it." Unable to describe  Ideas of Reference:None  Suicidal Thoughts:No  Homicidal Thoughts:No   Sensorium  Memory:Immediate Fair  Judgment:Fair  Insight:Fair   Executive Functions  Concentration:Fair  Attention Span:Fair  Recall:Fair  Fund of Knowledge:Fair  Language:Fair   Psychomotor Activity  Psychomotor Activity:Normal   Assets  Assets:Physical Health; Desire for Improvement; Communication Skills   Sleep  Sleep:Fair  Number of hours: 7   Nutritional Assessment (For OBS and FBC admissions only) Has the patient had a weight loss or gain of 10 pounds or more in the last 3 months?: No Has the patient had a decrease in food intake/or appetite?: No Does the patient have dental problems?: No Does the patient have eating habits or behaviors that may be indicators of an eating disorder including binging or inducing vomiting?: No Has the patient recently lost weight without trying?: 0 Has the patient been eating poorly because of a decreased appetite?: 0 Malnutrition Screening Tool Score: 0    Physical Exam: Physical Exam Constitutional:      Appearance: He is not toxic-appearing or diaphoretic.  HENT:     Head: Normocephalic.     Right Ear: External ear normal.     Left Ear: External ear normal.  Eyes:     General:        Right eye: No discharge.        Left eye: No discharge.  Cardiovascular:     Rate and Rhythm: Normal rate.  Pulmonary:     Effort: No respiratory distress.  Chest:     Chest wall: No tenderness.  Neurological:     Mental Status: He is alert and oriented to person, place, and time.  Psychiatric:        Attention and Perception: He does not perceive auditory or visual hallucinations.        Mood and Affect: Mood and affect normal.        Speech: Speech  normal.        Behavior: Behavior is cooperative.        Thought Content: Thought content is not paranoid or delusional. Thought content does not include homicidal or suicidal ideation. Thought content does not include homicidal or suicidal plan.        Cognition and Memory: Cognition normal.        Judgment: Judgment normal.    Review of Systems  Constitutional:  Negative for diaphoresis and fever.  HENT:  Negative for congestion.   Eyes:  Negative for pain and discharge.  Respiratory:  Negative for cough, shortness of breath and wheezing.   Cardiovascular:  Negative for chest pain and palpitations.  Gastrointestinal:  Negative for diarrhea, nausea and vomiting.  Neurological:  Negative for seizures, loss of consciousness, weakness and headaches.  Psychiatric/Behavioral:  Positive for substance abuse. Negative for depression, hallucinations, memory loss and suicidal ideas. The patient is not nervous/anxious and does not have insomnia.    Blood pressure 105/61, pulse 61, temperature 98.9 F (37.2 C), temperature source Oral, resp. rate 16, SpO2 99 %. There is no height or weight on file to calculate BMI.  Musculoskeletal: Strength & Muscle Tone: within normal limits Gait & Station: normal Patient leans: N/A   [facility] MSE Discharge Disposition for Follow up and Recommendations: Based on my evaluation the patient does not appear to have an emergency medical condition and can be discharged with resources and follow up care in outpatient services for Medication Management, Individual Therapy, and area shelters.  Resources for area shelters provided to patient. No evidence of imminent risk to self or others. Pt does not meet IVC criteria or in-patient hospitalization.  Discussed emergency plan, calling 911, and going to the ED. Pt provided opportunity for questions.  Pt discharged in stable condition.    Mancel Bale, NP 11/20/2021, 3:06 AM

## 2021-11-21 ENCOUNTER — Ambulatory Visit (HOSPITAL_COMMUNITY)
Admission: EM | Admit: 2021-11-21 | Discharge: 2021-11-21 | Disposition: A | Discharge status: Home / Self Care | Payer: Medicaid Other

## 2021-11-21 DIAGNOSIS — F1290 Cannabis use, unspecified, uncomplicated: Secondary | ICD-10-CM

## 2021-11-21 DIAGNOSIS — F209 Schizophrenia, unspecified: Secondary | ICD-10-CM

## 2021-11-21 DIAGNOSIS — Z5900 Homelessness unspecified: Secondary | ICD-10-CM

## 2021-11-21 NOTE — ED Provider Notes (Signed)
Behavioral Health Urgent Care Medical Screening Exam  Patient Name: Mario Lloyd MRN: [id number] Date of Evaluation: 11/21/21 Chief Complaint:   Diagnosis:  Final diagnoses:  Schizophrenia, unspecified type (HCC)  Homelessness  Cannabis use disorder    History of Present illness: Mario Lloyd is a 21 y.o. male with a history of schizophrenia who presents voluntarily to [facility]. Patient reports that he would like to restart his medication. States that he is followed by [name], NOP for medication management, but has not seen her in approximately 2 months. Patient states that he has not reached out to her for a follow up appointment. Patient amenable to following up at [facility] during walk-in hours. On evaluation patient is alert and oriented x 4, pleasant, and cooperative. Speech is clear and coherent. Mood is euthymic and affect is congruent with mood. Thought process is coherent and thought content is logical. Denies auditory and visual hallucinations. No indication that patient is responding to internal stimuli. No evidence of delusional thought content. Denies suicidal ideations. Denies homicidal ideations. Reports daily use of marijuana. Denies use of other substances.   Psychiatric Specialty Exam  Presentation  General Appearance:Appropriate for Environment; Fairly Groomed  Eye Contact:Fair  Speech:Clear and Coherent; Normal Rate  Speech Volume:Normal  Handedness:Right   Mood and Affect  Mood:Euthymic  Affect:Congruent   Thought Process  Thought Processes:Coherent  Descriptions of Associations:Intact  Orientation:Full (Time, Place and Person)  Thought Content:Logical  Diagnosis of Schizophrenia or Schizoaffective disorder in past: Yes  Duration of Psychotic Symptoms: Greater than six months  Hallucinations:None "nothing really sometimes I hear sounds that's it." Unable to describe  Ideas of Reference:None  Suicidal Thoughts:No  Homicidal  Thoughts:No   Sensorium  Memory:Immediate Fair  Judgment:Fair  Insight:Fair   Executive Functions  Concentration:Fair  Attention Span:Fair  Recall:Fair  Fund of Knowledge:Fair  Language:Good   Psychomotor Activity  Psychomotor Activity:Normal   Assets  Assets:Desire for Improvement; Physical Health   Sleep  Sleep:Fair  Number of hours: 7   Nutritional Assessment (For OBS and FBC admissions only) Has the patient had a weight loss or gain of 10 pounds or more in the last 3 months?: No Has the patient had a decrease in food intake/or appetite?: No Does the patient have dental problems?: No Does the patient have eating habits or behaviors that may be indicators of an eating disorder including binging or inducing vomiting?: No Has the patient recently lost weight without trying?: 0 Has the patient been eating poorly because of a decreased appetite?: 0 Malnutrition Screening Tool Score: 0    Physical Exam: Physical Exam Constitutional:      General: He is not in acute distress.    Appearance: He is not ill-appearing, toxic-appearing or diaphoretic.  HENT:     Head: Normocephalic.     Right Ear: External ear normal.     Left Ear: External ear normal.  Eyes:     Pupils: Pupils are equal, round, and reactive to light.  Cardiovascular:     Rate and Rhythm: Normal rate.  Pulmonary:     Effort: Pulmonary effort is normal. No respiratory distress.  Musculoskeletal:        General: Normal range of motion.  Skin:    General: Skin is warm and dry.  Neurological:     Mental Status: He is alert and oriented to person, place, and time.  Psychiatric:        Mood and Affect: Mood is not anxious or depressed.  Speech: Speech normal.        Behavior: Behavior is cooperative.        Thought Content: Thought content is not paranoid or delusional. Thought content does not include homicidal or suicidal ideation. Thought content does not include suicidal plan.     Review of Systems  Constitutional:  Negative for chills, diaphoresis, fever, malaise/fatigue and weight loss.  HENT:  Negative for congestion.   Respiratory:  Negative for cough and shortness of breath.   Cardiovascular:  Negative for chest pain and palpitations.  Gastrointestinal:  Negative for diarrhea, nausea and vomiting.  Neurological:  Negative for dizziness and seizures.  Psychiatric/Behavioral:  Positive for substance abuse. Negative for depression, hallucinations, memory loss and suicidal ideas. The patient has insomnia. The patient is not nervous/anxious.   All other systems reviewed and are negative.   Blood pressure 100/64, pulse 64, temperature 98.7 F (37.1 C), temperature source Oral, resp. rate 18, SpO2 97 %. There is no height or weight on file to calculate BMI.  Musculoskeletal: Strength & Muscle Tone: within normal limits Gait & Station: normal Patient leans: N/A   [facility] MSE Discharge Disposition for Follow up and Recommendations: Based on my evaluation the patient does not appear to have an emergency medical condition and can be discharged with resources and follow up care in outpatient services for Medication Management and Individual Therapy    Discharge Instructions      Outpatient Services  [facility] [address] [place] [address] [place], [place] [address] [phone]   [facility]: [address] [place], [place] [phone]    [facility] the [place] [address]  [place], [place] [address] [phone]   [facility] [address] [place], [place]  [address] [phone]   Assessment/Therapy Walk-Ins will be available on Monday and Wednesday's Only  Monday/Wednesday: 8 AM until slots are full Every 1st and 2nd Friday of the month: 1 pm - 5 pm  For Monday/Wednesday, it is recommended that patients arrive between 7:30 AM and 7:45 AM because patients will be seen in the order of arrival.  Go to the  second floor on arrival and check in. For Fridays, it is recommended that patients arrive between 12 noon and 12:30 pm. Go to the second floor on arrival and check in.  **Availability is limited; therefore, patients may not be seen on the same day.**  Psychiatry/Medication Management Walk-Ins will be available Monday-Friday  Monday-Friday: 8 AM to 11 AM.  It is recommended that patients arrive by 7:30 AM to 7:45 AM because patients will be seen in the order of arrival.  Go to the second floor on arrival and check in.  **Availability is limited; therefore, patients may not be seen on the same day.**        Jackelyn Poling, NP 11/21/2021, 12:47 AM

## 2021-11-21 NOTE — Discharge Instructions (Addendum)
Outpatient Services  [facility] [address] [place], [place] [address] [phone]   [facility]: [address] [place], [place] [phone]    [facility] [place] [address]  [place], [place] [address] [phone]   [facility] [address] [place], [place]  [address] [phone]   Assessment/Therapy Walk-Ins will be available on Monday and Wednesday's Only  Monday/Wednesday: 8 AM until slots are full Every 1st and 2nd Friday of the month: 1 pm - 5 pm  For Monday/Wednesday, it is recommended that patients arrive between 7:30 AM and 7:45 AM because patients will be seen in the order of arrival.  Go to the second floor on arrival and check in. For Fridays, it is recommended that patients arrive between 12 noon and 12:30 pm. Go to the second floor on arrival and check in.  **Availability is limited; therefore, patients may not be seen on the same day.**  Psychiatry/Medication Management Walk-Ins will be available Monday-Friday  Monday-Friday: 8 AM to 11 AM.  It is recommended that patients arrive by 7:30 AM to 7:45 AM because patients will be seen in the order of arrival.  Go to the second floor on arrival and check in.  **Availability is limited; therefore, patients may not be seen on the same day.**

## 2021-11-21 NOTE — Progress Notes (Signed)
[facility] uploaded resources into AVS to utilize after discharge.    Nancee Liter [facility]

## 2021-11-21 NOTE — Progress Notes (Signed)
   11/21/21 0047  Patient Reported Information  How Did You Hear About [name]? Self  What Is the Reason for Your Visit/Call Today? Clinician asked the pt, "what brought you to the hospital?" Pt reports, his vision changed. Clinician asked clarifying questions about his vision, pt replied, "it's hard to explain." Pt reports, he wants to get back on his medications. Pt denies, SI, HI, AVH, self-injurious behaviors and access to weapons. Clinician provide information to open access hours at [facility] for medication management.  How Long Has This Been Causing You Problems? > than 6 months  What Do You Feel Would Help You the Most Today? Alcohol or Drug Use Treatment;Medication(s);Housing Assistance  Have You Recently Had Any Thoughts About Hurting Yourself? No  Are You Planning to Commit Suicide/Harm Yourself At This time? No  Have you Recently Had Thoughts About Hurting Someone [name]? No  Are You Planning To Harm Someone At This Time? No  Have You Used Any Alcohol or Drugs in the Past 24 Hours? Yes  What Did You Use and How Much? Pt reports, smoking a blunt today. Pt reports, he smokes Marijuana everyday.  Do You Currently Have a Therapist/Psychiatrist? Yes  Name of Therapist/Psychiatrist Pt is linked to [name], DNP, FNP-C, PMHNP-BC at [facility] for medication managment. Pt reports, he does not remember the names of his medications. Pt reports, he most recent medication appointment was two months ago.  [facility] Screening Triage Referral Assessment  Type of Contact Face-to-Face  Location of Assessment [facility] Assessment Services  Provider location [facility] Assessment Services  Collateral Involvement None  Patient Determined To Be At Risk for Harm To Self or Others Based on Review of Patient Reported Information or Presenting Complaint? No  Does Patient Present under Involuntary Commitment? No  [place] of Residence [place]  Patient Currently Receiving the Following Services:  Medication Management  Determination of Need Routine (7 days)  Options For Referral Medication Management     Determination of need: Routine.     Redmond Pulling, MS, [facility] Triage Specialist [phone]

## 2021-11-22 ENCOUNTER — Ambulatory Visit (INDEPENDENT_AMBULATORY_CARE_PROVIDER_SITE_OTHER): Payer: Medicaid Other | Admitting: Student in an Organized Health Care Education/Training Program

## 2021-11-22 DIAGNOSIS — F203 Undifferentiated schizophrenia: Secondary | ICD-10-CM

## 2021-11-22 MED ORDER — HALOPERIDOL 5 MG PO TABS: 5.0000 mg | ORAL_TABLET | Freq: Two times a day (BID) | ORAL | 0 refills | Status: AC

## 2021-11-22 MED ORDER — BENZTROPINE MESYLATE 0.5 MG PO TABS: 0.5000 mg | ORAL_TABLET | Freq: Two times a day (BID) | ORAL | 0 refills | Status: AC

## 2021-11-22 MED ORDER — ARIPIPRAZOLE 5 MG PO TABS: 5.0000 mg | ORAL_TABLET | Freq: Every day | ORAL | 0 refills | Status: AC

## 2021-11-22 MED ORDER — DIVALPROEX SODIUM 250 MG PO DR TAB: 250.0000 mg | DELAYED_RELEASE_TABLET | Freq: Two times a day (BID) | ORAL | 0 refills | Status: AC

## 2021-11-22 NOTE — Progress Notes (Signed)
Psychiatric Initial Adult Assessment   Patient Identification: Mario Lloyd MRN:  [id number] Date of Evaluation:  11/22/2021 Referral Source: [facility] Chief Complaint:  No chief complaint on file.  Visit Diagnosis:    ICD-10-CM   1. Schizophrenia, undifferentiated (HCC)  F20.3 benztropine (COGENTIN) 0.5 MG tablet    divalproex (DEPAKOTE) 250 MG DR tablet    haloperidol (HALDOL) 5 MG tablet    ARIPiprazole (ABILIFY) 5 MG tablet      History of Present Illness:   Mario Lloyd is a 21 yr old male who presents to establish care and for medication management.  PPHx is significant for Schizophrenia and 1 prior hospitalization.  He reports that he is here today to get back on medications.  He states he knows he needs them and wants to restart them.  He states he feels like his brain is not in the right place.  He states he is trying to focus on his strengths but it is difficult right now.  He states he has forgotten how to breathe.  He states when he is talking with others it is difficult to stay in the conversations.  When asked about what happened between him and his father a few weeks ago he states "he kicked me out."  He states he still talks with his father on the phone daily.  He has a past psychiatric history significant for schizophrenia.  He states he is only been hospitalized once and that was at [facility] 12/2020.  He reports no history of suicide attempts or self-injurious behavior.  Per chart review and his recollection he has been on citalopram, Cogentin, Haldol, Depakote, and Abilify maintain it in the past.  He reports his last injection was about a month or 2 ago.  He states [facility]  When asked why he was no longer going he states that I just have not made a follow-up appointment.  He is currently homeless.  He reports he is not currently working.  He states he does smoke THC almost every day and his last use was about 2 days ago.  He reports he knows he  needs to stop and will be trying to do so.  He reports he smokes black and mild so occasionally.  He reports no alcohol use.  He reports no current legal issues.  He reports no access to firearms.  He reports he did graduate high school.  Discussed restarting medications with him.  Since he was stable on his past regimen discussed restarting him on Depakote, Haldol, Cogentin, and oral Abilify.  Discussed that if he tolerated these medications well he could transition to LAI of Haldol and Abilify as he had previously been on.  He was agreeable to this.  He reports no SI, HI, or AVH.  He does endorse thoughts of mind reading, thought insertion/deletion, and special messages in the TV.  Discussed with him what to do in the event of a future crisis.  Discussed that he can return to [facility], go to [facility], go to the nearest ED, or call 911 or [phone].   He reported understanding and had no concerns.    Associated Signs/Symptoms: Depression Symptoms:   Cannot really answer but reports being sad due to current situation (Hypo) Manic Symptoms:   Reports has not slept for a year Anxiety Symptoms:  Excessive Worry, Panic Symptoms, Psychotic Symptoms:   Reports AVH- I see and hear blanks, Thought Insertion/Deletion, Mind Reading,  Special Messages in the  TV PTSD Symptoms: NA  Past Psychiatric History: Schizophrenia and 1 prior hospitalization.   Previous Psychotropic Medications: Yes  Abilify, Haldol, Cogentin, Depakote, Citalopram  Substance Abuse History in the last 12 months:  Yes.    Consequences of Substance Abuse: Medical Consequences:  Destabilizing possibly leading to hospitalization  Past Medical History:  Past Medical History:  Diagnosis Date   Anxiety    Asthma    Depression    Eczema    Eczema    Psychosis (HCC) 06/07/2015   Substance induced mood disorder (HCC) 06/14/2015   No past surgical history on file.  Family Psychiatric History: "Not Sure"  Family History: No family history on  file.  Social History:   Social History   Socioeconomic History   Marital status: Single    Spouse name: Not on file   Number of children: Not on file   Years of education: Not on file   Highest education level: Not on file  Occupational History   Not on file  Tobacco Use   Smoking status: Never    Passive exposure: Never   Smokeless tobacco: Never  Vaping Use   Vaping Use: Never used  Substance and Sexual Activity   Alcohol use: No   Drug use: Yes    Types: Marijuana    Comment: UDS was negative; Pt + Benzos   Sexual activity: Never  Other Topics Concern   Not on file  Social History Narrative   Not on file   Social Determinants of Health   Financial Resource Strain: Not on file  Food Insecurity: Not on file  Transportation Needs: Not on file  Physical Activity: Not on file  Stress: Not on file  Social Connections: Not on file    Additional Social History: Currently homeless. Reports will stop smoking THC.  Allergies:  No Known Allergies  Metabolic Disorder Labs: Lab Results  Component Value Date   HGBA1C 5.4 01/10/2021   MPG 108.28 01/10/2021   MPG 105 08/14/2016   Lab Results  Component Value Date   PROLACTIN 0.3 (L) 01/10/2021   PROLACTIN 8.1 08/14/2016   Lab Results  Component Value Date   CHOL 131 01/12/2021   TRIG 47 01/12/2021   HDL 40 (L) 01/12/2021   CHOLHDL 3.3 01/12/2021   VLDL 9 01/12/2021   LDLCALC 82 01/12/2021   LDLCALC 61 08/14/2016   Lab Results  Component Value Date   TSH 0.604 01/10/2021    Therapeutic Level Labs: No results found for: "LITHIUM" No results found for: "CBMZ" Lab Results  Component Value Date   VALPROATE <10 (L) 08/23/2021    Current Medications: Current Outpatient Medications  Medication Sig Dispense Refill   ARIPiprazole (ABILIFY) 5 MG tablet Take 1 tablet (5 mg total) by mouth daily. 30 tablet 0   albuterol (VENTOLIN HFA) 108 (90 Base) MCG/ACT inhaler Inhale 2 puffs into the lungs every 4 (four)  hours as needed for wheezing or shortness of breath. (Patient not taking: Reported on 01/03/2021) 18 g 0   benztropine (COGENTIN) 0.5 MG tablet Take 1 tablet (0.5 mg total) by mouth 2 (two) times daily. 60 tablet 0   divalproex (DEPAKOTE) 250 MG DR tablet Take 1 tablet (250 mg total) by mouth every 12 (twelve) hours. 60 tablet 0   haloperidol (HALDOL) 5 MG tablet Take 1 tablet (5 mg total) by mouth 2 (two) times daily. 60 tablet 0   No current facility-administered medications for this visit.    Musculoskeletal: Strength & Muscle Tone:  within normal limits Gait & Station: normal Patient leans: N/A  Psychiatric Specialty Exam: Review of Systems  Respiratory:  Negative for shortness of breath.   Cardiovascular:  Negative for chest pain.  Gastrointestinal:  Negative for abdominal pain, constipation, diarrhea, nausea and vomiting.  Neurological:  Negative for dizziness, weakness and headaches.  Psychiatric/Behavioral:  Negative for agitation, behavioral problems, dysphoric mood, hallucinations, self-injury and suicidal ideas. The patient is not nervous/anxious.     There were no vitals taken for this visit.There is no height or weight on file to calculate BMI.  General Appearance: Bizarre and maloderous  Eye Contact:  Fair  Speech:  Clear and Coherent and Normal Rate  Volume:  Normal  Mood:  Euthymic  Affect:  Constricted and Flat  Thought Process:  Scattered Descriptions of Associations: Intact  Orientation:  Full (Time, Place, and Person)  Thought Content:   Reports no SI, HI, or AVH at present.  Endorses Thought Insertion/Deletion, Mind Reading, Special Messages in the TV  Suicidal Thoughts:  No  Homicidal Thoughts:  No  Memory:  Immediate;   Fair Recent;   Poor  Judgement:  Intact  Insight:  Present  Psychomotor Activity:  Normal  Concentration:  Concentration: Fair and Attention Span: Fair  Recall:  Poor  Fund of Knowledge:Poor  Language: Fair  Akathisia:  Negative  Handed:   Right  AIMS (if indicated):  not done  Assets:  Desire for Improvement Physical Health Resilience  ADL's:  Intact  Cognition: WNL  Sleep:  Fair   Screenings: AIMS    Flowsheet Row [facility]) [facility] Ad[facility] (Discharged) [facility] Admission (Discharged) from OP [facility]  AIMS Total Score 0 0 22 0      AUDIT    Flowsheet Row [facility]) [facility] Admission (Discharged) [facility]  Alcohol Use Disorder Identification Test Final Score (AUDIT) 0 0      PHQ2-9    Flowsheet Row ED from 01/10/2021 in [facility]  PHQ-2 Total Score 2  PHQ-9 Total Score 5      Flowsheet Row ED from 08/23/2021 in [facility] ED from 08/22/2021 in [facility] EMERGENCY DEPARTMENT ED from 08/18/2021 in [facility] EMERGENCY DEPARTMENT  C-SSRS RISK CATEGORY High Risk No Risk No Risk       Assessment and Plan:  Kimmie Doren is a 21 yr old male who presents to establish care and for medication management.  PPHx is significant for Schizophrenia and 1 prior hospitalization.   Jr had previously done well with his regiment our goal will be to return to that.  He was restarted on Haldol, Cogentin, and Depakote during a visit to the [facility] without issue.  So today we will restart the Abilify.  If he tolerates the Haldol and Abilify oral well we can transition one or both to LAI at a later time.  He will return to the office in 4 weeks.   Schizophrenia: -Continue Haldol 5 mg BID -Continue Cogentin 0.5 mg BID for drug induced  EPS -Continue Depakote 250 mg BID for mood stability -Restart Abilify 5 mg daily    Collaboration of Care: Case Discussed with Supervising Physician Dr.  Lucianne Muss.  Patient/Guardian was advised Release of Information must be obtained prior to any record release in order to collaborate their care with an outside provider. Patient/Guardian was advised if they have not already done so to contact the registration department to sign all necessary forms in order for [name] to release information regarding their care.   Consent: Patient/Guardian gives verbal consent for treatment and assignment of benefits for services provided during this visit. Patient/Guardian expressed understanding and agreed to proceed.   Lauro Franklin, MD 7/6/202312:00 PM

## 2021-11-23 ENCOUNTER — Encounter (HOSPITAL_COMMUNITY): Payer: Self-pay

## 2021-11-23 ENCOUNTER — Emergency Department (HOSPITAL_COMMUNITY)
Admission: EM | Admit: 2021-11-23 | Discharge: 2021-11-24 | Disposition: A | Discharge status: Home / Self Care | Payer: Medicaid Other | Attending: Emergency Medicine | Admitting: Emergency Medicine

## 2021-11-23 ENCOUNTER — Other Ambulatory Visit: Payer: Self-pay

## 2021-11-23 DIAGNOSIS — F209 Schizophrenia, unspecified: Secondary | ICD-10-CM | POA: Insufficient documentation

## 2021-11-23 DIAGNOSIS — R519 Headache, unspecified: Secondary | ICD-10-CM | POA: Diagnosis not present

## 2021-11-23 NOTE — ED Provider Triage Note (Signed)
Emergency Medicine Provider Triage Evaluation Note  Mario Lloyd , a 21 y.o. male  was evaluated in triage.  Pt complains of being hot and blurry vision.  Patient is homeless.  He states that he felt hot and dehydrated and needed a place to rest.  He also wants to know if he can be evaluated because he feels like his vision is blurry and he cannot see well.  This has been going on for some time..  Review of Systems  Positive: Blurry vision Negative: Syncope  Physical Exam  BP 118/69 (BP Location: Left Arm)   Pulse 65   Temp 98.4 F (36.9 C) (Oral)   Resp 18   Ht 5\' 6"  (1.676 m)   SpO2 98%   BMI 26.15 kg/m  Gen:   Awake, no distress   Resp:  Normal effort  MSK:   Moves extremities without difficulty  Other:  Grossly intact vision,   Medical Decision Making  Medically screening exam initiated at 4:04 PM.  Appropriate orders placed.  Mario Lloyd was informed that the remainder of the evaluation will be completed by another provider, this initial triage assessment does not replace that evaluation, and the importance of remaining in the ED until their evaluation is complete.  Work-up initiate. I asked the patient directly if all he needed was to rest and get some water however he states that he needs to be seen for his eye complaint   Vern Claude, PA-C 11/23/21 1606

## 2021-11-23 NOTE — ED Triage Notes (Signed)
Pt arrived POV from home c/o dehydration. Pt states he feels that way because he is hot. Pt also is asking is there anyway he can get some rest here.

## 2021-11-23 NOTE — ED Notes (Signed)
Patient was sleep and didn't hear name being called.

## 2021-11-23 NOTE — ED Notes (Signed)
X2 no response and not visible in the lobby 

## 2021-11-24 MED ORDER — IBUPROFEN 400 MG PO TABS
600.0000 mg | ORAL_TABLET | Freq: Once | ORAL | Status: AC
  Administered 2021-11-24: 600 mg via ORAL
  Filled 2021-11-24: qty 1

## 2021-11-24 NOTE — ED Provider Notes (Addendum)
[facility] EMERGENCY DEPARTMENT Provider Note   CSN: [id number] Arrival date & time: 11/23/21  1400     History  Chief Complaint  Patient presents with   Fatigue    Mario Lloyd is a 21 y.o. male.  21 year old male with past medical history of schizophrenia presents today for evaluation of headache and needing a place to rest.  He denies weakness, SI/HI/AVH.  Reports compliance with his home medications.  Patient is homeless.  States he does not have difficulty sleeping. Headache ongoing for a month.   The history is provided by the patient. No language interpreter was used.       Home Medications Prior to Admission medications   Medication Sig Start Date End Date Taking? Authorizing Provider  albuterol (VENTOLIN HFA) 108 (90 Base) MCG/ACT inhaler Inhale 2 puffs into the lungs every 4 (four) hours as needed for wheezing or shortness of breath. Patient not taking: Reported on 01/03/2021 11/19/19   Irean Hong, MD  ARIPiprazole (ABILIFY) 5 MG tablet Take 1 tablet (5 mg total) by mouth daily. 11/22/21 12/22/21  Lauro Franklin, MD  benztropine (COGENTIN) 0.5 MG tablet Take 1 tablet (0.5 mg total) by mouth 2 (two) times daily. 11/22/21   Lauro Franklin, MD  divalproex (DEPAKOTE) 250 MG DR tablet Take 1 tablet (250 mg total) by mouth every 12 (twelve) hours. 11/22/21   Lauro Franklin, MD  haloperidol (HALDOL) 5 MG tablet Take 1 tablet (5 mg total) by mouth 2 (two) times daily. 11/22/21   Lauro Franklin, MD      Allergies    Patient has no known allergies.    Review of Systems   Review of Systems  Constitutional:  Negative for chills and fever.  Eyes:  Negative for photophobia and visual disturbance.  Gastrointestinal:  Negative for abdominal pain.  Neurological:  Positive for headaches. Negative for weakness and light-headedness.  Psychiatric/Behavioral:  Negative for sleep disturbance and suicidal ideas.   All other systems reviewed and are  negative.   Physical Exam Updated Vital Signs BP 126/78   Pulse (!) 57   Temp 98.2 F (36.8 C) (Oral)   Resp 18   Ht 5\' 6"  (1.676 m)   SpO2 97%   BMI 26.15 kg/m  Physical Exam Vitals and nursing note reviewed.  Constitutional:      General: He is not in acute distress.    Appearance: Normal appearance. He is not ill-appearing.  HENT:     Head: Normocephalic and atraumatic.     Nose: Nose normal.  Eyes:     Conjunctiva/sclera: Conjunctivae normal.  Cardiovascular:     Rate and Rhythm: Normal rate and regular rhythm.  Pulmonary:     Effort: Pulmonary effort is normal. No respiratory distress.     Breath sounds: Normal breath sounds. No wheezing.  Musculoskeletal:        General: No deformity.  Skin:    Findings: No rash.  Neurological:     Mental Status: He is alert.     Comments: Cranial nerves III through XII intact.  Tongue midline.  Without dysarthria.  Full range of motion of bilateral upper and lower extremities.  5/5 strength in upper and lower extremities.  Sensation intact and symmetrical.  Without pronator drift.     ED Results / Procedures / Treatments   Labs (all labs ordered are listed, but only abnormal results are displayed) Labs Reviewed - No data to display  EKG None  Radiology No results found.  Procedures Procedures    Medications Ordered in ED Medications - No data to display  ED Course/ Medical Decision Making/ A&P                           Medical Decision Making  Ibuprofen ordered.  Patient is appropriate for discharge.  Discharged in stable condition.  Neurological exam nonfocal.  He is without SI, HI, or AVH.  Patient eloped prior to receiving ibuprofen.  Final Clinical Impression(s) / ED Diagnoses Final diagnoses:  Nonintractable headache, unspecified chronicity pattern, unspecified headache type    Rx / DC Orders ED Discharge Orders     None         Marita Kansas, PA-C 11/24/21 0201    Marita Kansas, PA-C 11/24/21  0203    Melene Plan, DO 11/24/21 0203

## 2021-11-24 NOTE — Discharge Instructions (Addendum)
Your exam today was reassuring.  You are given Motrin.  You have worsening symptoms please return for evaluation.

## 2021-11-24 NOTE — ED Notes (Signed)
The pt is homeless and he reports that he is tired   some chest pain and  nausea  asking for juice  no needs a doctor order

## 2021-12-06 ENCOUNTER — Telehealth (HOSPITAL_COMMUNITY): Payer: Self-pay

## 2021-12-06 NOTE — BH Assessment (Signed)
Care Management - [facility] Follow Up Discharges   Writer attempted to make contact with patient today and was unsuccessful.  Phone just rang.   Per chart review, patient is homeless.  Patient was provided with outpatient resources.

## 2021-12-25 ENCOUNTER — Encounter (HOSPITAL_COMMUNITY): Payer: Medicaid Other | Admitting: Student in an Organized Health Care Education/Training Program

## 2022-12-23 ENCOUNTER — Ambulatory Visit (HOSPITAL_COMMUNITY)
Admission: EM | Admit: 2022-12-23 | Discharge: 2022-12-23 | Disposition: A | Discharge status: Home / Self Care | Payer: MEDICAID | Attending: Registered Nurse | Admitting: Registered Nurse

## 2022-12-23 ENCOUNTER — Encounter (HOSPITAL_COMMUNITY): Payer: Self-pay | Admitting: Registered Nurse

## 2022-12-23 DIAGNOSIS — F32A Depression, unspecified: Secondary | ICD-10-CM | POA: Insufficient documentation

## 2022-12-23 DIAGNOSIS — F1994 Other psychoactive substance use, unspecified with psychoactive substance-induced mood disorder: Secondary | ICD-10-CM | POA: Diagnosis present

## 2022-12-23 DIAGNOSIS — F1210 Cannabis abuse, uncomplicated: Secondary | ICD-10-CM | POA: Diagnosis present

## 2022-12-23 DIAGNOSIS — F200 Paranoid schizophrenia: Secondary | ICD-10-CM | POA: Diagnosis present

## 2022-12-23 NOTE — Progress Notes (Signed)
   12/23/22 1459  [facility] Triage Screening (Walk-ins at [facility] only)  How Did You Hear About [name]? Self  What Is the Reason for Your Visit/Call Today? Pt arrived to [facility] not accompanied by anyone. Pt states that he is having alot of issues, but he is unable to verbalize what is going on. Pt endorses AVH at this time, stating that he hears sounds & complete silence and sees pitch black. Pt denies any alcohol or substance use in the last 24 hours, but states he smoked a black & mild earlier today. Pt states that he had some liquor (tequila) a week ago.  How Long Has This Been Causing You Problems? > than 6 months  Have You Recently Had Any Thoughts About Hurting Yourself? No  Are You Planning to Commit Suicide/Harm Yourself At This time? No  Have you Recently Had Thoughts About Hurting Someone [name]? No  Are You Planning To Harm Someone At This Time? No  Are you currently experiencing any auditory, visual or other hallucinations? Yes  Please explain the hallucinations you are currently experiencing: hearing sounds and sometimes complete silence and seeing pitch black  Have You Used Any Alcohol or Drugs in the Past 24 Hours? No  Do you have any current medical co-morbidities that require immediate attention? No  Clinician description of patient physical appearance/behavior: well groomed, unorganized thoughts during triage, unable to answer a question with a clear statement  What Do You Feel Would Help You the Most Today? Social Support;Treatment for Depression or other mood problem  If access to [facility] was not available, would you have sought care in the Emergency Department? No  Determination of Need Routine (7 days)  Options For Referral Outpatient Therapy;Medication Management

## 2022-12-23 NOTE — ED Provider Notes (Signed)
[facility] Medical Screening Exam  Patient Name: Mario Lloyd MRN: [id number] Date of Evaluation: 12/23/22 Chief Complaint:   Diagnosis:  Final diagnoses:  Schizophrenia, paranoid (HCC)  Cannabis abuse  Substance induced mood disorder (HCC)    History of Present illness: Mario Lloyd is a 22 y.o. male patient presented to [facility] as a walk in dropped off by his father Mario Lloyd [phone] with complaints of not being his self.     Mario Lloyd, 22 y.o., male patient seen face to face by this provider, chart reviewed, and consulted with Dr. Nelly Rout on 12/23/22.  On evaluation Mario Lloyd reports he was dropped off by his father because he has been having problems "with myself and having issues.  Things that could be detrimental to my health."  Patient asked to explain "I got depression, anxiety, I've been doing a lot of forward thinking but I falter when I try to go where I'm trying to get."  When asked about having auditory or visual hallucinations he states "I see things that are there but when I visualize it, it is not there."  He was asked to explain.  There was a chair sitting in exam room and patient was asked if he was able to see the empty chair.  "Yes I see the chair but not when I visualize it."  Provider asked do you see the chair and patient states "Yes, but is it a chair."  Patient states he is hearing voices and then states that he isn't currently hearing voices but hears a bell ring.  Asked if he was hearing a bell ring now "No"  When was the last time you heard the bell ring "I don't know.  Ya'll don't understand when I be trying to explain.  I been hearing and seeing thing every since I was a child."  After asking if he was hearing or seeing this at this time he states "No."  Patient also states that he is paranoid but unable to describe what he is paranoid about.  Patient denies suicidal/self/harm/homicidal ideation.  Patient states he has  outpatient psychiatric services but is unable to give the name.  He states he is taking psychotropic medications and hands provider a piece of paper that has his medication written on it.  Patient endorse marijuana use.  When asked how often he used he states "Not often but a lot, maybe a blunt or 2."  Asked to explain "I don't do it everyday but I might do it one day and not the next.  Not often but a lot."  Patient states he lives with his father in [place] and is unemployed.  States he hasn't worked since 2023.  Patient gave permission to speak to his father for collateral information.   During evaluation Mario Lloyd is seated in exam room dressed appropriately for weather and neat.  There is no noted distress.  He is alert/oriented x 4, calm, cooperative, attentive, and responses to assessment questions were vague, circumstantial response.  He spoke in a clear tone at moderate volume, and normal pace, with good eye contact.   He denies suicidal/self-harm/homicidal ideation, psychosis, and paranoia.  Objectively there is no evidence of psychosis/mania or delusional thinking.  He conversed coherently, with goal directed thoughts, no distractibility, or pre-occupation.  At this time Mario Lloyd is educated and verbalizes understanding of mental health resources and other crisis services in the community. He is instructed to call  911 and present to the nearest emergency room should he experience any suicidal/homicidal ideation, auditory/visual/hallucinations, or detrimental worsening of his mental health condition.  Rescheduled appointment with [facility] for medication management.    Collateral Information:   Father:  Mario Lloyd states that his son was brought in because he has been talking to himself and pacing.  "I don't think he has been taking his medicine like he is suppose to."  Report patient has outpatient psychiatric services with [facility] and "he gets a shot once a  month."  Reports last appointment was around the middle of July and not sure when next appointment scheduled "but should be coming up soon.  No concerns related to patient safety or that patient may be a danger to himself or other.  States he will be able to pick patient up in 45 minutes.    Spoke to staff at [facility]:  States patient last visit was 11/29/22 and next visit scheduled for 12/30/22.  Informed of patient symptoms and fathers complaints that patient talking to himself, pacing, and not acting like himself.  Rescheduled appointment for 12/27/22 at 10:45 AM for medication management.  States patient is taking the following medication:  Benztropine 0.5 mg Bid for EPS, Celexa 10 mg daily for anxiety/depression, Trazodone 50 mg Q hs for sleep, Abilify Maintena 400 mg monthly for schizophrenia, Risperidone 2 mg Q hs for mood stabilization.     Flowsheet Row ED from 12/23/2022 in [facility] ED from 11/23/2021 in [facility] Emergency Department at [facility] ED from 08/23/2021 in [facility]  C-SSRS RISK CATEGORY No Risk No Risk High Risk       Psychiatric Specialty Exam  Presentation  General Appearance:Appropriate for Environment; Casual; Neat  Eye Contact:Fair  Speech:Clear and Coherent; Normal Rate  Speech Volume:Normal  Handedness:Right   Mood and Affect  Mood: Euthymic  Affect: Appropriate; Congruent   Thought Process  Thought Processes: Coherent; Linear  Descriptions of Associations:Circumstantial  Orientation:Full (Time, Place and Person)  Thought Content:Rumination  Diagnosis of Schizophrenia or Schizoaffective disorder in past: No data recorded Duration of Psychotic Symptoms: No data recorded Hallucinations:None; Visual (Denies at this time but states he does hear voices or a bell ringing sometimes.  Was unable to tell last time he heard anything) Patient states he is "seeing stuff that is  there but when I visualize it it's not there."  Ideas of Reference:Paranoia (He states he is paranoid but unable to describe what he is paranoid about)  Suicidal Thoughts:No  Homicidal Thoughts:No   Sensorium  Memory: Immediate Fair; Recent Fair  Judgment: Fair  Insight: Present   Executive Functions  Concentration: Fair  Attention Span: Fair  Recall: [employer] of Knowledge: Fair  Language: Good   Psychomotor Activity  Psychomotor Activity: Normal   Assets  Assets: Communication Skills; Desire for Improvement; Housing; Physical Health; Resilience; Social Support   Sleep  Sleep: Good  Number of hours: No data recorded  Physical Exam: Physical Exam Vitals and nursing note reviewed. Chaperone present: Dropped off by his father.  Constitutional:      General: He is not in acute distress.    Appearance: Normal appearance. He is not ill-appearing.  HENT:     Head: Normocephalic.  Eyes:     Conjunctiva/sclera: Conjunctivae normal.  Cardiovascular:     Rate and Rhythm: Normal rate.  Pulmonary:     Effort: Pulmonary effort is normal. No respiratory distress.  Musculoskeletal:        General: Normal range of motion.     Cervical back: Normal range of motion.  Skin:    General: Skin is warm and dry.  Neurological:     Mental Status: He is alert and oriented to person, place, and time.  Psychiatric:        Attention and Perception: He perceives auditory and visual hallucinations.    Review of Systems  Constitutional:        No other complaints voiced  Psychiatric/Behavioral:  Depression: Stable. Hallucinations: Denies that he is currently hearing voices or seeing things; but states he does have a history of auditory/visual hallucinations. Substance abuse: Marijuana. Suicidal ideas: Denies. Nervous/anxious: Stable.   All other systems reviewed and are negative.  Blood pressure 123/77, pulse 63, temperature 97.9 F (36.6 C), resp. rate 17, SpO2  99%. There is no height or weight on file to calculate BMI.  Musculoskeletal: Strength & Muscle Tone: within normal limits Gait & Station: normal Patient leans: N/A   [facility] MSE Discharge Disposition for Follow up and Recommendations: Based on my evaluation the patient does not appear to have an emergency medical condition and can be discharged with resources and follow up care in outpatient services for Medication Management and Individual Therapy    Follow-up Information     [facility].   Contact information: You have a scheduled appointment with Dr. Aggie Cosier for medication management on 12/27/2022 at 10:45 AM.  If you are unable to keep appointment please call to reschedule                 Andy Moye, NP 12/23/2022, 5:13 PM

## 2022-12-23 NOTE — ED Notes (Signed)
Patient discharged by provider Dell Ponto, NP with written and verbal instructions. Resources provided.

## 2022-12-27 ENCOUNTER — Other Ambulatory Visit: Payer: Self-pay

## 2022-12-27 ENCOUNTER — Emergency Department (HOSPITAL_COMMUNITY)
Admission: EM | Admit: 2022-12-27 | Discharge: 2022-12-29 | Disposition: A | Discharge status: Other Acute Inpatient Hospital | Payer: MEDICAID | Attending: Emergency Medicine | Admitting: Emergency Medicine

## 2022-12-27 ENCOUNTER — Encounter (HOSPITAL_COMMUNITY): Payer: Self-pay

## 2022-12-27 DIAGNOSIS — F203 Undifferentiated schizophrenia: Secondary | ICD-10-CM | POA: Diagnosis not present

## 2022-12-27 DIAGNOSIS — R440 Auditory hallucinations: Secondary | ICD-10-CM | POA: Diagnosis present

## 2022-12-27 DIAGNOSIS — F29 Unspecified psychosis not due to a substance or known physiological condition: Secondary | ICD-10-CM | POA: Diagnosis present

## 2022-12-27 DIAGNOSIS — Z20822 Contact with and (suspected) exposure to covid-19: Secondary | ICD-10-CM | POA: Diagnosis not present

## 2022-12-27 LAB — COMPREHENSIVE METABOLIC PANEL
ALT: 16 U/L (ref 0–44)
AST: 21 U/L (ref 15–41)
Albumin: 3.7 g/dL (ref 3.5–5.0)
Alkaline Phosphatase: 56 U/L (ref 38–126)
Anion gap: 8 (ref 5–15)
BUN: 11 mg/dL (ref 6–20)
CO2: 24 mmol/L (ref 22–32)
Calcium: 8.8 mg/dL — ABNORMAL LOW (ref 8.9–10.3)
Chloride: 103 mmol/L (ref 98–111)
Creatinine, Ser: 0.93 mg/dL (ref 0.61–1.24)
GFR, Estimated: >60 mL/min (ref >60)
Glucose, Bld: 60 mg/dL — ABNORMAL LOW (ref 70–99)
Potassium: 4 mmol/L (ref 3.5–5.1)
Sodium: 135 mmol/L (ref 135–145)
Total Bilirubin: 0.4 mg/dL (ref 0.3–1.2)
Total Protein: 7.5 g/dL (ref 6.5–8.1)

## 2022-12-27 LAB — RAPID URINE DRUG SCREEN, HOSP PERFORMED
Amphetamines: NOT DETECTED
Barbiturates: NOT DETECTED
Benzodiazepines: NOT DETECTED
Cocaine: NOT DETECTED
Opiates: NOT DETECTED
Tetrahydrocannabinol: NOT DETECTED

## 2022-12-27 LAB — ETHANOL: Alcohol, Ethyl (B): <10 mg/dL (ref <10)

## 2022-12-27 LAB — CBC
HCT: 41.8 % (ref 39.0–52.0)
Hemoglobin: 12.9 g/dL — ABNORMAL LOW (ref 13.0–17.0)
MCH: 27.3 pg (ref 26.0–34.0)
MCHC: 30.9 g/dL (ref 30.0–36.0)
MCV: 88.4 fL (ref 80.0–100.0)
Platelets: 306 10*3/uL (ref 150–400)
RBC: 4.73 MIL/uL (ref 4.22–5.81)
RDW: 13.3 % (ref 11.5–15.5)
WBC: 8.2 10*3/uL (ref 4.0–10.5)
nRBC: 0 % (ref 0.0–0.2)

## 2022-12-27 LAB — ACETAMINOPHEN LEVEL: Acetaminophen (Tylenol), Serum: <10 ug/mL — ABNORMAL LOW (ref 10–30)

## 2022-12-27 LAB — SALICYLATE LEVEL: Salicylate Lvl: <7 mg/dL — ABNORMAL LOW (ref 7.0–30.0)

## 2022-12-27 MED ORDER — RISPERIDONE 1 MG PO TBDP
4.0000 mg | ORAL_TABLET | Freq: Every day | ORAL | Status: DC
  Administered 2022-12-27 – 2022-12-28 (×2): 4 mg via ORAL
  Filled 2022-12-27 (×3): qty 4

## 2022-12-27 MED ORDER — TRAZODONE HCL 100 MG PO TABS
100.0000 mg | ORAL_TABLET | Freq: Every day | ORAL | Status: DC
  Administered 2022-12-27 – 2022-12-28 (×2): 100 mg via ORAL
  Filled 2022-12-27: qty 1
  Filled 2022-12-27: qty 2

## 2022-12-27 MED ORDER — BENZTROPINE MESYLATE 1 MG PO TABS
0.5000 mg | ORAL_TABLET | Freq: Two times a day (BID) | ORAL | Status: DC
  Administered 2022-12-27 – 2022-12-29 (×4): 0.5 mg via ORAL
  Filled 2022-12-27 (×5): qty 1

## 2022-12-27 NOTE — ED Provider Notes (Cosign Needed Addendum)
[place] EMERGENCY DEPARTMENT AT [facility] Provider Note   CSN: [id number] Arrival date & time: 12/27/22  1155     History  Chief Complaint  Patient presents with   Psychiatric Evaluation    Mario Lloyd is a 22 y.o. male with history of bipolar disorder, schizophrenia, presents because his psychiatrist told him to come to the ER.  He does not have any concerns at this time.  Denies any suicidal ideation or homicidal ideation.  Endorses some auditory hallucinations with bells ringing.  He states it is "a lot to figure out" with his current psychiatric medications.  Reports it is sometimes difficult to sleep in his shack outside, however denies staying up for multiple nights at a time.  HPI     Home Medications Prior to Admission medications   Medication Sig Start Date End Date Taking? Authorizing Provider  albuterol (VENTOLIN HFA) 108 (90 Base) MCG/ACT inhaler Inhale 2 puffs into the lungs every 4 (four) hours as needed for wheezing or shortness of breath. Patient not taking: Reported on 01/03/2021 11/19/19   Irean Hong, MD  ARIPiprazole (ABILIFY) 5 MG tablet Take 1 tablet (5 mg total) by mouth daily. 11/22/21 12/22/21  Lauro Franklin, MD  benztropine (COGENTIN) 0.5 MG tablet Take 1 tablet (0.5 mg total) by mouth 2 (two) times daily. 11/22/21   Lauro Franklin, MD  divalproex (DEPAKOTE) 250 MG DR tablet Take 1 tablet (250 mg total) by mouth every 12 (twelve) hours. 11/22/21   Lauro Franklin, MD  haloperidol (HALDOL) 5 MG tablet Take 1 tablet (5 mg total) by mouth 2 (two) times daily. 11/22/21   Lauro Franklin, MD      Allergies    Patient has no known allergies.    Review of Systems   Review of Systems  Psychiatric/Behavioral:  Negative for self-injury and suicidal ideas.     Physical Exam Updated Vital Signs BP 122/81   Pulse 65   Temp 97.8 F (36.6 C) (Oral)   Resp 17   Ht 5\' 6"  (1.676 m)   Wt 72.6 kg   SpO2 100%   BMI 25.82 kg/m   Physical Exam Vitals and nursing note reviewed.  Constitutional:      Appearance: Normal appearance.     Comments: Calm appearing sitting in bed  HENT:     Head: Atraumatic.  Cardiovascular:     Rate and Rhythm: Normal rate and regular rhythm.  Pulmonary:     Effort: Pulmonary effort is normal. No respiratory distress.     Breath sounds: Normal breath sounds.  Neurological:     General: No focal deficit present.     Mental Status: He is alert.  Psychiatric:        Mood and Affect: Mood normal.        Behavior: Behavior normal.     Comments: Thought content normal, no pressured speech, no thoughts of grandiosity     ED Results / Procedures / Treatments   Labs (all labs ordered are listed, but only abnormal results are displayed) Labs Reviewed  COMPREHENSIVE METABOLIC PANEL - Abnormal; Notable for the following components:      Result Value   Glucose, Bld 60 (*)    Calcium 8.8 (*)    All other components within normal limits  SALICYLATE LEVEL - Abnormal; Notable for the following components:   Salicylate Lvl <7.0 (*)    All other components within normal limits  ACETAMINOPHEN LEVEL - Abnormal; Notable  for the following components:   Acetaminophen (Tylenol), Serum <10 (*)    All other components within normal limits  CBC - Abnormal; Notable for the following components:   Hemoglobin 12.9 (*)    All other components within normal limits  ETHANOL  RAPID URINE DRUG SCREEN, HOSP PERFORMED    EKG None  Radiology No results found.  Procedures Procedures    Medications Ordered in ED Medications - No data to display  ED Course/ Medical Decision Making/ A&P Clinical Course as of 12/27/22 1544  Fri Dec 27, 2022  1501 Pending TTS disposition. No SI, HI. Is endorsing AH. Voluntary.  [CG]    Clinical Course User Index [CG] Al Decant, PA-C                                 Medical Decision Making Amount and/or Complexity of Data Reviewed Labs:  ordered.   22 y.o. male with pertinent past medical history of bipolar, schizophrenia presents to the ED because his psychiatrist earlier wanted him to come  Differential diagnosis includes but is not limited to acute psychosis, suicidal ideation, homicidal ideation  ED Course:  Patient presents to the ER as he reports his psychiatrist earlier wanted to come.  He was at this appointment earlier today.  He has no complaints except for having difficulty managing his medications.  He reports he is taking his medications though and has not run out.  He denies any suicidal ideation or homicidal ideation.  Reports auditory hallucinations of bells ringing.  Does not appear acutely psychotic, thought content normal, no pressured speech, no thoughts of grandiosity.  Will obtain urine drug screen, CBC, CMP, acetaminophen, salicylate, ethanol level. TTS consult placed for further recommendations No detectable alcohol, salicylates, acetaminophen, urine drug screen negative.  CBC unremarkable.  CMP with blood glucose of 60, patient was given food by nursing to bring this up.  Patient medically cleared, awaiting TTS consult for disposition   Impression: Patient requesting psychiatric evaluation  Disposition:  Patient handed off to Mario Bison, PA who will follow up TTS recommendations  Lab Tests: I Ordered, and personally interpreted labs.  The pertinent results include:   CMP with low glucose at 60 Alcohol, salicylate, acetaminophen level undetectable CBC with no leukocytosis Urine drug screen negative  Consultations Obtained: I requested consultation with TTS, awaiting recommendations  External records from outside source obtained and reviewed including urgent care visit from a couple days ago for psychiatric reasons   Co morbidities that complicate the patient evaluation  bipolar, schizophrenia              Final Clinical Impression(s) / ED Diagnoses Final diagnoses:  None     Rx / DC Orders ED Discharge Orders     None         Arabella Merles, PA-C 12/27/22 1545    Arabella Merles, PA-C 12/27/22 1551    Laurence Spates, MD 12/29/22 [phone]

## 2022-12-27 NOTE — Consult Note (Cosign Needed)
[facility] ED ASSESSMENT   Reason for Consult:  Patient sent over by his psychiatrist, no SI/HI, endorses auditory hallucinations.  Would appreciate recommendations Referring Physician:  Arabella Merles  Patient Identification: Mario Lloyd MRN:  [id number] ED Chief Complaint: Psychosis [facility])  Diagnosis:  Principal Problem:   Psychosis [facility])   ED Assessment Time Calculation: Start Time: 1400 Stop Time: 1515 Total Time in Minutes (Assessment Completion): 75  HPI:  Mario Lloyd is a 22 y.o. male patient present to the emergency department because his psychiatrist told him to come in.  Patient has a history of schizophrenia, MDD, bipolar disorder and substance use disorder.  Subjective:   Mario Lloyd is a 22 y.o. male patient present to the emergency department because his psychiatrist told him to come in.  Patient has a history of schizophrenia, MDD, bipolar disorder and substance use disorder.  Patient is seen face to face while seated on a stretcher in the hallway.  Patient is cooperative with assessment.  He says he came in because his psychiatrist told him to come in.  He says "I'm not steady"  and "I haven't been for a long time." Patient endorses auditory and visual hallucinations, but is unable to articulate the content of his hallucinations.  He states he has been taking his oral medications as prescribed.  He lives with his father and likes to play basketball.  The patient is not employed.  Patient says he wants help to get better.     He is alert and oriented X 4.  He is disheveled.  He answers most of the assessment questions but at times appears suspicious and doesn't give clear answers. Patient says he is worried people are out to get him, but again can not articulate who or why.  Patient states he smokes black and tans daily and marijuana daily "when I have money."  He acquires his marijuana for "a guy on the street."  He endorses having 1 alcoholic drink maybe once a week.       [employer] Sim[place] (Dad) [phone]  Dad says the patient was scheduled to receive his LAI this AM and the psychiatrist decided not give the shot because the patient has not been doing well for a while.  The psychiatrist told the patient and his father that the patient would need to go inpatient to have his medications changed.  Father reports patient has been increasingly paranoid, asking strange questions and talking about weird scenarios that didn't actually happen.  Dad also reports that patient is not sleeping; he has seen the patient on cameras outside pacing at night. Patient's outpatient provider is [facility] [phone]  Patient is decompensating and psychotic.  He requires inpatient psychiatric hospitalization for stabilization and medication management.     Past Psychiatric History: prior hospitalization, schizophrenia, bipolar disorder, MDD and substance abuse  Risk to Self or Others: Is the patient at risk to self? No Has the patient been a risk to self in the past 6 months? No Has the patient been a risk to self within the distant past? No Is the patient a risk to others? No Has the patient been a risk to others in the past 6 months? No Has the patient been a risk to others within the distant past? No  [place] Scale:  Flowsheet Row ED from 12/27/2022 in [facility] Emergency Department at [facility] ED from 12/23/2022 in [facility] ED from 11/23/2021 in [facility] Department at [facility]  C-SSRS RISK CATEGORY No Risk No Risk No Risk       Substance Abuse:   Regular marijuana use  Past Medical History:  Past Medical History:  Diagnosis Date   Anxiety    Asthma    Depression    Eczema    Eczema    Psychosis (HCC) 06/07/2015   Substance induced mood disorder (HCC) 06/14/2015   History reviewed. No pertinent surgical history. Family History: History reviewed. No pertinent family  history. Family Psychiatric  History: No family history noted Social History:  Social History   Substance and Sexual Activity  Alcohol Use Yes     Social History   Substance and Sexual Activity  Drug Use Yes   Types: Marijuana   Comment: UDS was negative; Pt + Benzos    Social History   Socioeconomic History   Marital status: Single    Spouse name: Not on file   Number of children: Not on file   Years of education: Not on file   Highest education level: Not on file  Occupational History   Not on file  Tobacco Use   Smoking status: Every Day    Types: Cigarettes    Passive exposure: Never   Smokeless tobacco: Never   Tobacco comments:    Black and milds  Vaping Use   Vaping status: Never Used  Substance and Sexual Activity   Alcohol use: Yes   Drug use: Yes    Types: Marijuana    Comment: UDS was negative; Pt + Benzos   Sexual activity: Never  Other Topics Concern   Not on file  Social History Narrative   Not on file   Social Determinants of Health   Financial Resource Strain: Not on file  Food Insecurity: Not on file  Transportation Needs: Not on file  Physical Activity: Not on file  Stress: Not on file  Social Connections: Not on file   Additional Social History: Patient lives with father    Allergies:  No Known Allergies  Labs:  Results for orders placed or performed during the hospital encounter of 12/27/22 (from the past 48 hour(s))  Comprehensive metabolic panel     Status: Abnormal   Collection Time: 12/27/22 12:49 PM  Result Value Ref Range   Sodium 135 135 - 145 mmol/L   Potassium 4.0 3.5 - 5.1 mmol/L   Chloride 103 98 - 111 mmol/L   CO2 24 22 - 32 mmol/L   Glucose, Bld 60 (L) 70 - 99 mg/dL    Comment: Glucose reference range applies only to samples taken after fasting for at least 8 hours.   BUN 11 6 - 20 mg/dL   Creatinine, Ser 8.29 0.61 - 1.24 mg/dL   Calcium 8.8 (L) 8.9 - 10.3 mg/dL   Total Protein 7.5 6.5 - 8.1 g/dL   Albumin 3.7  3.5 - 5.0 g/dL   AST 21 15 - 41 U/L   ALT 16 0 - 44 U/L   Alkaline Phosphatase 56 38 - 126 U/L   Total Bilirubin 0.4 0.3 - 1.2 mg/dL   GFR, Estimated >56 >21 mL/min    Comment: (NOTE) Calculated using the CKD-EPI Creatinine Equation (2021)    Anion gap 8 5 - 15    Comment: Performed at [facility], [address] [place], [place] [address]  Ethanol     Status: None   Collection Time: 12/27/22 12:49 PM  Result Value Ref Range  Alcohol, Ethyl (B) <10 <10 mg/dL    Comment: (NOTE) Lowest detectable limit for serum alcohol is 10 mg/dL.  For medical purposes only. Performed at [facility], [address], [place], [place] [address]   Salicylate level     Status: Abnormal   Collection Time: 12/27/22 12:49 PM  Result Value Ref Range   Salicylate Lvl <7.0 (L) 7.0 - 30.0 mg/dL    Comment: Performed at [facility], [address] [place], [place] [address]  Acetaminophen level     Status: Abnormal   Collection Time: 12/27/22 12:49 PM  Result Value Ref Range   Acetaminophen (Tylenol), Serum <10 (L) 10 - 30 ug/mL    Comment: (NOTE) Therapeutic concentrations vary significantly. A range of 10-30 ug/mL  may be an effective concentration for many patients. However, some  are best treated at concentrations outside of this range. Acetaminophen concentrations >150 ug/mL at 4 hours after ingestion  and >50 ug/mL at 12 hours after ingestion are often associated with  toxic reactions.  Performed at [facility], [address], [place], [place] [address]   cbc     Status: Abnormal   Collection Time: 12/27/22 12:49 PM  Result Value Ref Range   WBC 8.2 4.0 - 10.5 K/uL   RBC 4.73 4.22 - 5.81 MIL/uL   Hemoglobin 12.9 (L) 13.0 - 17.0 g/dL   HCT 91.4 78.2 - 95.6 %   MCV 88.4 80.0 - 100.0 fL   MCH 27.3 26.0 - 34.0 pg   MCHC 30.9 30.0 - 36.0 g/dL   RDW 21.3 08.6 - 57.8 %   Platelets 306 150 - 400 K/uL   nRBC 0.0 0.0 - 0.2 %    Comment: Performed at [facility], [address] [place], [place] [address]  Rapid urine drug screen (hospital performed)     Status: None   Collection Time: 12/27/22  2:34 PM  Result Value Ref Range   Opiates NONE DETECTED NONE DETECTED   Cocaine NONE DETECTED NONE DETECTED   Benzodiazepines NONE DETECTED NONE DETECTED   Amphetamines NONE DETECTED NONE DETECTED   Tetrahydrocannabinol NONE DETECTED NONE DETECTED   Barbiturates NONE DETECTED NONE DETECTED    Comment: (NOTE) DRUG SCREEN FOR MEDICAL PURPOSES ONLY.  IF CONFIRMATION IS NEEDED FOR ANY PURPOSE, NOTIFY LAB WITHIN 5 DAYS.  LOWEST DETECTABLE LIMITS FOR URINE DRUG SCREEN Drug Class                     Cutoff (ng/mL) Amphetamine and metabolites    1000 Barbiturate and metabolites    200 Benzodiazepine                 200 Opiates and metabolites        300 Cocaine and metabolites        300 THC                            50 Performed at [facility], [address], [place], [place] [address]     No current facility-administered medications for this encounter.   Current Outpatient Medications  Medication Sig Dispense Refill   ARIPiprazole (ABILIFY) 5 MG tablet Take 1 tablet (5 mg total) by mouth daily. 30 tablet 0   benztropine (COGENTIN) 0.5 MG tablet Take 1 tablet (0.5 mg total) by mouth 2 (two) times daily. 60 tablet 0   citalopram (CELEXA) 10  MG tablet Take 10 mg by mouth daily.     haloperidol (HALDOL) 5 MG tablet Take 1 tablet (5 mg total) by mouth 2 (two) times daily. 60 tablet 0   risperiDONE (RISPERDAL) 2 MG tablet Take 2 mg by mouth at bedtime.     traZODone (DESYREL) 50 MG tablet Take 50-100 mg by mouth at bedtime.     divalproex (DEPAKOTE) 250 MG DR tablet Take 1 tablet (250 mg total) by mouth every 12 (twelve) hours. (Patient not taking: Reported on 12/27/2022) 60 tablet 0    Musculoskeletal: Strength & Muscle Tone: within normal limits Gait & Station: normal Patient leans: N/A   Psychiatric Specialty Exam: Presentation   General Appearance:  Disheveled  Eye Contact: Fair  Speech: Clear and Coherent; Normal Rate  Speech Volume: Normal  Handedness: Right   Mood and Affect  Mood: Irritable  Affect: Appropriate   Thought Process  Thought Processes: Coherent  Descriptions of Associations:Circumstantial  Orientation:Full (Time, Place and Person)  Thought Content:Delusions  History of Schizophrenia/Schizoaffective disorder:Yes  Duration of Psychotic Symptoms:Greater than six months  Hallucinations:Hallucinations: Auditory; Visual Description of Auditory Hallucinations: Unable to articulate Description of Visual Hallucinations: Unable to articulate  Ideas of Reference:Delusions; Paranoia  Suicidal Thoughts:Suicidal Thoughts: No  Homicidal Thoughts:Homicidal Thoughts: No   Sensorium  Memory: Immediate Fair; Recent Fair; Remote Fair  Judgment: Fair  Insight: Fair   Art therapist  Concentration: Fair  Attention Span: Fair  Recall: Fair  Fund of Knowledge: Fair  Language: Good   Psychomotor Activity  Psychomotor Activity: Psychomotor Activity: Normal   Assets  Assets: Communication Skills; Desire for Improvement; Housing; Social Support; Leisure Time    Sleep  Sleep: Sleep: Poor Number of Hours of Sleep: 1   Physical Exam: Physical Exam Vitals and nursing note reviewed.  Eyes:     Pupils: Pupils are equal, round, and reactive to light.  Pulmonary:     Effort: Pulmonary effort is normal.  Skin:    General: Skin is dry.  Neurological:     Mental Status: He is alert and oriented to person, place, and time.  Psychiatric:        Attention and Perception: He perceives auditory and visual hallucinations.        Behavior: Behavior is agitated.        Thought Content: Thought content is paranoid and delusional.    Review of Systems  Psychiatric/Behavioral:  Positive for hallucinations.   All other systems reviewed and are  negative.  Blood pressure 122/81, pulse 65, temperature 97.8 F (36.6 C), temperature source Oral, resp. rate 17, height 5\' 6"  (1.676 m), weight 72.6 kg, SpO2 100%. Body mass index is 25.82 kg/m.  Medical Decision Making: Patient case reviewed and discussed with Dr Daleen Bo.  Patient is decompensating and psychotic.  Recommend inpatient psychiatric hospitalization for stabilization and medication management.   Problem 1: Psychosis -Risperdal 4mg  Q HS -Trazodone 100mg  PO Q HS -Benztropine 0.5mg  PO BID  Disposition:  Recommend inpatient psychiatric hospitalization  Thomes Lolling, NP 12/27/2022 4:00 PM

## 2022-12-27 NOTE — ED Provider Notes (Signed)
  Physical Exam  BP 122/81   Pulse 65   Temp 97.8 F (36.6 C) (Oral)   Resp 17   Ht 5\' 6"  (1.676 m)   Wt 72.6 kg   SpO2 100%   BMI 25.82 kg/m   Physical Exam Vitals and nursing note reviewed.  Constitutional:      General: He is not in acute distress.    Appearance: He is well-developed.  HENT:     Head: Normocephalic and atraumatic.  Eyes:     Conjunctiva/sclera: Conjunctivae normal.  Cardiovascular:     Rate and Rhythm: Normal rate and regular rhythm.     Heart sounds: No murmur heard. Pulmonary:     Effort: Pulmonary effort is normal. No respiratory distress.     Breath sounds: Normal breath sounds.  Abdominal:     Palpations: Abdomen is soft.     Tenderness: There is no abdominal tenderness.  Musculoskeletal:        General: No swelling.     Cervical back: Neck supple.  Skin:    General: Skin is warm and dry.     Capillary Refill: Capillary refill takes less than 2 seconds.  Neurological:     Mental Status: He is alert.  Psychiatric:        Mood and Affect: Mood normal.     Procedures  Procedures  ED Course / MDM   Clinical Course as of 12/27/22 1650  Fri Dec 27, 2022  1501 Pending TTS disposition. No SI, HI. Is endorsing AH. Voluntary.  [CG]    Clinical Course User Index [CG] Al Decant, PA-C   Medical Decision Making Amount and/or Complexity of Data Reviewed Labs: ordered.   22 year old male presents to ED for evaluation.  Please see HPI for further details.  In short, this is a 22 year old male who presents to the ED for evaluation.  Patient was sent here from psychiatry.  The patient arrives appearing disheveled however denies SI, HI.  The patient is endorsing auditory hallucinations.  Patient signed out to me pending TTS consult.  The patient is medically cleared for TTS disposition.  TTS recommends inpatient psychiatric stabilization for this patient.  Patient medications have been ordered by psychiatry.  Medical reconciliation has  been ordered by myself.  Patient diet has been ordered.  Patient is medically stable at this time.  He will be made provider default.    Al Decant, PA-C 12/27/22 1650    Arby Barrette, MD 12/31/22 1511

## 2022-12-27 NOTE — ED Triage Notes (Signed)
Pt presents stating that psychiatry advised him to come to the ED. Pt states he has been depressed. Denies SI/HI or hallucinations. Pt states he thinks they want him to be admitted. Pt noted to have tangential thoughts during triage. Pt calm and cooperative presenting voluntarily.

## 2022-12-27 NOTE — ED Notes (Signed)
Sandwich bad and soda provided at this time

## 2022-12-28 ENCOUNTER — Encounter (HOSPITAL_COMMUNITY): Payer: Self-pay | Admitting: Psychiatry

## 2022-12-28 DIAGNOSIS — F203 Undifferentiated schizophrenia: Secondary | ICD-10-CM

## 2022-12-28 LAB — RESP PANEL BY RT-PCR (RSV, FLU A&B, COVID)  RVPGX2
Influenza A by PCR: NEGATIVE
Influenza B by PCR: NEGATIVE
Resp Syncytial Virus by PCR: NEGATIVE
SARS Coronavirus 2 by RT PCR: NEGATIVE

## 2022-12-28 NOTE — ED Notes (Signed)
Patients items are in locker #4

## 2022-12-28 NOTE — Progress Notes (Signed)
[facility] Psych ED Progress Note  12/28/2022 5:04 PM Mario Lloyd  MRN:  [id number]   Subjective:    Patient seen today for psychiatric face-to-face reevaluation.  Upon reevaluation, engagement largely characterized by concrete, atypical, vague, and odd thought process, atypical and odd interpersonal style, expression of auditory hallucinations, odd limited insight into events that transpired that led to his hospitalization this encounter, and appreciable significant negative symptoms such as not attending to hygiene and severe malodor.  Patient denies suicidal or homicidal ideations.  Patient denies visual hallucinations currently.  Patient intermittently seems somewhat oddly related.  Patient endorses toleration of medications is good.  Patient endorses eating and sleeping fair.  Patient tells me his mood is, "My mood is cool I guess, yeah it's cool".  Per nursing:  Takes medications, eats good, sleep fair, no behavioral problems in the emergency room thus far.  Principal Problem: Undifferentiated schizophrenia (HCC) Diagnosis:  Principal Problem:   Undifferentiated schizophrenia [facility]   ED Assessment Time Calculation: Start Time: 1650 Stop Time: 1700 Total Time in Minutes (Assessment Completion): 10   Past Psychiatric History: Undifferentiated schizophrenia  [place] Scale:  Flowsheet Row ED from 12/27/2022 in [facility] Emergency Department at [facility] ED from 12/23/2022 in [facility] ED from 11/23/2021 in [facility] Emergency Department at [facility]  C-SSRS RISK CATEGORY No Risk No Risk No Risk       Past Medical History:  Past Medical History:  Diagnosis Date   Anxiety    Asthma    Depression    Eczema    Eczema    Psychosis (HCC) 06/07/2015   Substance induced mood disorder (HCC) 06/14/2015   History reviewed. No pertinent surgical history. Family History: History reviewed. No pertinent family history. Family Psychiatric   History: None endorsed Social History:  Social History   Substance and Sexual Activity  Alcohol Use Yes     Social History   Substance and Sexual Activity  Drug Use Yes   Types: Marijuana   Comment: UDS was negative; Pt + Benzos    Social History   Socioeconomic History   Marital status: Single    Spouse name: Not on file   Number of children: Not on file   Years of education: Not on file   Highest education level: Not on file  Occupational History   Not on file  Tobacco Use   Smoking status: Every Day    Types: Cigarettes    Passive exposure: Never   Smokeless tobacco: Never   Tobacco comments:    Black and milds  Vaping Use   Vaping status: Never Used  Substance and Sexual Activity   Alcohol use: Yes   Drug use: Yes    Types: Marijuana    Comment: UDS was negative; Pt + Benzos   Sexual activity: Never  Other Topics Concern   Not on file  Social History Narrative   Not on file   Social Determinants of Health   Financial Resource Strain: Not on file  Food Insecurity: Not on file  Transportation Needs: Not on file  Physical Activity: Not on file  Stress: Not on file  Social Connections: Not on file    Sleep: Fair  Appetite:  Good  Current Medications: Current Facility-Administered Medications  Medication Dose Route Frequency Provider Last Rate Last Admin   benztropine (COGENTIN) tablet 0.5 mg  0.5 mg Oral BID Weber, Kyra A, NP   0.5 mg at 12/28/22 1046  risperiDONE (RISPERDAL M-TABS) disintegrating tablet 4 mg  4 mg Oral QHS Weber, Kyra A, NP   4 mg at 12/27/22 2214   traZODone (DESYREL) tablet 100 mg  100 mg Oral QHS Weber, Kyra A, NP   100 mg at 12/27/22 2213   Current Outpatient Medications  Medication Sig Dispense Refill   ARIPiprazole (ABILIFY) 5 MG tablet Take 1 tablet (5 mg total) by mouth daily. 30 tablet 0   benztropine (COGENTIN) 0.5 MG tablet Take 1 tablet (0.5 mg total) by mouth 2 (two) times daily. 60 tablet 0   citalopram (CELEXA) 10  MG tablet Take 10 mg by mouth daily.     haloperidol (HALDOL) 5 MG tablet Take 1 tablet (5 mg total) by mouth 2 (two) times daily. 60 tablet 0   risperiDONE (RISPERDAL) 2 MG tablet Take 2 mg by mouth at bedtime.     traZODone (DESYREL) 50 MG tablet Take 50-100 mg by mouth at bedtime.     divalproex (DEPAKOTE) 250 MG DR tablet Take 1 tablet (250 mg total) by mouth every 12 (twelve) hours. (Patient not taking: Reported on 12/27/2022) 60 tablet 0    Lab Results:  Results for orders placed or performed during the hospital encounter of 12/27/22 (from the past 48 hour(s))  Comprehensive metabolic panel     Status: Abnormal   Collection Time: 12/27/22 12:49 PM  Result Value Ref Range   Sodium 135 135 - 145 mmol/L   Potassium 4.0 3.5 - 5.1 mmol/L   Chloride 103 98 - 111 mmol/L   CO2 24 22 - 32 mmol/L   Glucose, Bld 60 (L) 70 - 99 mg/dL    Comment: Glucose reference range applies only to samples taken after fasting for at least 8 hours.   BUN 11 6 - 20 mg/dL   Creatinine, Ser 8.75 0.61 - 1.24 mg/dL   Calcium 8.8 (L) 8.9 - 10.3 mg/dL   Total Protein 7.5 6.5 - 8.1 g/dL   Albumin 3.7 3.5 - 5.0 g/dL   AST 21 15 - 41 U/L   ALT 16 0 - 44 U/L   Alkaline Phosphatase 56 38 - 126 U/L   Total Bilirubin 0.4 0.3 - 1.2 mg/dL   GFR, Estimated >64 >33 mL/min    Comment: (NOTE) Calculated using the CKD-EPI Creatinine Equation (2021)    Anion gap 8 5 - 15    Comment: Performed at [facility], [address] [place], [place] [address]  Ethanol     Status: None   Collection Time: 12/27/22 12:49 PM  Result Value Ref Range   Alcohol, Ethyl (B) <10 <10 mg/dL    Comment: (NOTE) Lowest detectable limit for serum alcohol is 10 mg/dL.  For medical purposes only. Performed at [facility], [address] [place], [place] [address]   Salicylate level     Status: Abnormal   Collection Time: 12/27/22 12:49 PM  Result Value Ref Range   Salicylate Lvl <7.0 (L) 7.0 - 30.0 mg/dL    Comment:  Performed at [facility], [address] [place], [place] [address]  Acetaminophen level     Status: Abnormal   Collection Time: 12/27/22 12:49 PM  Result Value Ref Range   Acetaminophen (Tylenol), Serum <10 (L) 10 - 30 ug/mL    Comment: (NOTE) Therapeutic concentrations vary significantly. A range of 10-30 ug/mL  may be an effective concentration for many patients. However, some  are best treated at concentrations outside of this  range. Acetaminophen concentrations >150 ug/mL at 4 hours after ingestion  and >50 ug/mL at 12 hours after ingestion are often associated with  toxic reactions.  Performed at [facility], [address] [place], [place] [address]   cbc     Status: Abnormal   Collection Time: 12/27/22 12:49 PM  Result Value Ref Range   WBC 8.2 4.0 - 10.5 K/uL   RBC 4.73 4.22 - 5.81 MIL/uL   Hemoglobin 12.9 (L) 13.0 - 17.0 g/dL   HCT 19.1 47.8 - 29.5 %   MCV 88.4 80.0 - 100.0 fL   MCH 27.3 26.0 - 34.0 pg   MCHC 30.9 30.0 - 36.0 g/dL   RDW 62.1 30.8 - 65.7 %   Platelets 306 150 - 400 K/uL   nRBC 0.0 0.0 - 0.2 %    Comment: Performed at [facility], [address] [place], [place] [address]  Rapid urine drug screen (hospital performed)     Status: None   Collection Time: 12/27/22  2:34 PM  Result Value Ref Range   Opiates NONE DETECTED NONE DETECTED   Cocaine NONE DETECTED NONE DETECTED   Benzodiazepines NONE DETECTED NONE DETECTED   Amphetamines NONE DETECTED NONE DETECTED   Tetrahydrocannabinol NONE DETECTED NONE DETECTED   Barbiturates NONE DETECTED NONE DETECTED    Comment: (NOTE) DRUG SCREEN FOR MEDICAL PURPOSES ONLY.  IF CONFIRMATION IS NEEDED FOR ANY PURPOSE, NOTIFY LAB WITHIN 5 DAYS.  LOWEST DETECTABLE LIMITS FOR URINE DRUG SCREEN Drug Class                     Cutoff (ng/mL) Amphetamine and metabolites    1000 Barbiturate and metabolites    200 Benzodiazepine                 200 Opiates and metabolites        300 Cocaine and  metabolites        300 THC                            50 Performed at [facility], [address], [place], [place] [address]   Resp panel by RT-PCR (RSV, Flu A&B, Covid) Anterior Nasal Swab     Status: None   Collection Time: 12/28/22 12:39 PM   Specimen: Anterior Nasal Swab  Result Value Ref Range   SARS Coronavirus 2 by RT PCR NEGATIVE NEGATIVE   Influenza A by PCR NEGATIVE NEGATIVE   Influenza B by PCR NEGATIVE NEGATIVE    Comment: (NOTE) The Xpert Xpress SARS-CoV-2/FLU/RSV plus assay is intended as an aid in the diagnosis of influenza from Nasopharyngeal swab specimens and should not be used as a sole basis for treatment. Nasal washings and aspirates are unacceptable for Xpert Xpress SARS-CoV-2/FLU/RSV testing.  Fact Sheet for Patients: BloggerCourse.com  Fact Sheet for Healthcare Providers: SeriousBroker.it  This test is not yet approved or cleared by the [place] FDA and has been authorized for detection and/or diagnosis of SARS-CoV-2 by FDA under an Emergency Use Authorization (EUA). This EUA will remain in effect (meaning this test can be used) for the duration of the COVID-19 declaration under Section 564(b)(1) of the Act, 21 U.S.C. section 360bbb-3(b)(1), unless the authorization is terminated or revoked.     Resp Syncytial Virus by PCR NEGATIVE NEGATIVE    Comment: (NOTE) Fact Sheet for Patients: BloggerCourse.com  Fact Sheet for Healthcare Providers: SeriousBroker.it  This test is not yet approved or cleared by the [place] and has been authorized for detection and/or diagnosis of SARS-CoV-2 by FDA under an Emergency Use Authorization (EUA). This EUA will remain in effect (meaning this test can be used) for the duration of the COVID-19 declaration under Section 564(b)(1) of the Act, 21 U.S.C. section 360bbb-3(b)(1), unless the authorization  is terminated or revoked.  Performed at [facility], [address] [place], [place] [address]     Blood Alcohol level:  Lab Results  Component Value Date   [facility] <10 12/27/2022   ETH <10 08/22/2021    Physical Findings:  CIWA:    COWS:     Musculoskeletal: Strength & Muscle Tone: within normal limits Gait & Station: normal Patient leans: N/A  Psychiatric Specialty Exam:  Presentation  General Appearance:  Disheveled; Other (comment) (Atypical interpersonal style)  Eye Contact: Good  Speech: Clear and Coherent  Speech Volume: Normal  Handedness: Right   Mood and Affect  Mood: -- ("My mood is cool I guess, yeah it's cool")  Affect: Other (comment) (Oddly relaxed)   Thought Process  Thought Processes: Other (comment) (Short, vague, concrete)  Descriptions of Associations:Circumstantial  Orientation:Full (Time, Place and Person)  Thought Content:Other (comment) (Odd, atypical, vague, concrete)  History of Schizophrenia/Schizoaffective disorder:Yes  Duration of Psychotic Symptoms:Greater than six months  Hallucinations:Hallucinations: Auditory Description of Auditory Hallucinations: "I just hear voices, bells, sounds, you know" Description of Visual Hallucinations: Unable to articulate  Ideas of Reference:None  Suicidal Thoughts:Suicidal Thoughts: No  Homicidal Thoughts:Homicidal Thoughts: No   Sensorium  Memory: Remote Poor; Recent Poor; Immediate Poor  Judgment: Impaired  Insight: Lacking   Executive Functions  Concentration: Other (comment) (Variable)  Attention Span: Other (comment) (Variable)  Recall: Other (comment) (Variable)  Fund of Knowledge: Fair  Language: Fair   Psychomotor Activity  Psychomotor Activity: Psychomotor Activity: Normal   Assets  Assets: Desire for Improvement; Physical Health   Sleep  Sleep: Sleep: Fair Number of Hours of Sleep: 1    Physical Exam: Physical  Exam Vitals and nursing note reviewed.  Constitutional:      General: He is not in acute distress.    Appearance: He is not ill-appearing, toxic-appearing or diaphoretic.     Comments: Disheveled and malodorous   Neurological:     Mental Status: He is alert and oriented to person, place, and time.  Psychiatric:        Attention and Perception: He is inattentive. He perceives auditory ("I just hear voices, bells, sounds, you know") hallucinations. He does not perceive visual hallucinations.        Mood and Affect: Affect is inappropriate (Odd).        Speech: Speech is tangential (Vague, concrete, atypical).        Behavior: Behavior is withdrawn. Behavior is not agitated, slowed, aggressive, hyperactive or combative. Behavior is cooperative.        Thought Content: Thought content is not paranoid or delusional. Thought content does not include homicidal or suicidal ideation.        Judgment: Judgment is impulsive and inappropriate.    Review of Systems  Psychiatric/Behavioral:  Positive for hallucinations ("I just hear voices, bells, sounds, you know"). Negative for depression, substance abuse and suicidal ideas. The patient is not nervous/anxious and does not have insomnia.   All other systems reviewed and are negative.  Blood pressure 114/74, pulse 87, temperature 97.7 F (36.5 C), temperature source Oral, resp. rate 17, height  5\' 6"  (1.676 m), weight 72.6 kg, SpO2 100%. Body mass index is 25.82 kg/m.   Medical Decision Making:  Patient continues to present with symptomology consistent with undifferentiated schizophrenia i.e. concrete, vague, and atypical thought process, negative symptoms, and auditory hallucinations.  Plan is to transfer to inpatient hospitalization excepting facility tomorrow.  Recommendations  #Undifferentiated schizophrenia [facility]) -Transfer to inpatient hospitalization tomorrow 12/29/2022  -Continue medication regimen -Risperdal 4mg  Q HS -Trazodone 100mg  PO  Q HS -Benztropine 0.5mg  PO BID   Lenox Ponds, NP 12/28/2022, 5:04 PM

## 2022-12-28 NOTE — ED Notes (Signed)
Pt ate breakfast tray.

## 2022-12-28 NOTE — ED Provider Notes (Signed)
Emergency Medicine Observation Re-evaluation Note  Mario Lloyd is a 22 y.o. male, seen on rounds today.  Pt initially presented to the ED for complaints of Psychiatric Evaluation Currently, the patient is resting.  Physical Exam  BP 102/72 (BP Location: Right Arm)   Pulse 67   Temp 97.7 F (36.5 C) (Oral)   Resp 18   Ht 1.676 m (5\' 6" )   Wt 72.6 kg   SpO2 100%   BMI 25.82 kg/m  Physical Exam General: nad Cardiac: regular rate Lungs: normal effort Psych: calm, cooperative  ED Course / MDM  EKG:   I have reviewed the labs performed to date as well as medications administered while in observation.  Recent changes in the last 24 hours include no events.  Plan  Current plan is for inpt psych treatment.Linwood Dibbles, MD 12/28/22 1059

## 2022-12-28 NOTE — Progress Notes (Addendum)
Pt was accepted to [facility] TOMORROW 12/29/2022; Bed Assignment [facility] PENDING Negative COVID-19 Faxed to [phone].  Pt meets inpatient criteria per Shearon Stalls  Attending Physician will be Dr. Len Childs, MD  Report can be called to: - [phone]   Pt can arrive after: 8:00am on 12/29/2022  Care Team notified: [facility] Dereck Leep Dixon,RN, Shearon Stalls, [place], Bethany Hendra,LCSW   [place], [place] 12/28/2022 @ 12:39 PM

## 2022-12-28 NOTE — Progress Notes (Signed)
LCSW Progress Note  [id number]   Mario Lloyd  12/28/2022  11:59 AM    Inpatient Behavioral Health Placement  Pt meets inpatient criteria per Arsenio Loader, NP. There are no available beds within [facility] system per [facility] Linsey Strader,RN. Referral was sent to the following facilities;   Destination  Service Provider Address Phone [facility]  [address] [place], [place] [address] [phone]  [facility]  [address] [place] [address] [phone]  [facility] [address]  [address] [place] [address] [phone]  [facility]  [address] [place], [place] [address] [phone]  [facility]  [address] [place] [address] [phone]  [facility]  [address] [place], [place] [address] [phone]  [facility]  [address] [place] [address] [phone]  [facility]  [address] [place] [address] [phone]  [facility]  [address], [place] [address] [phone]  [facility]  [address], [place] [address] [phone]  [facility]  [address], [place] [address] [phone]  [facility]  [address] [place] [address] [phone]  [facility]  [place] [phone]  Renella Cunas  [place] -- [phone]  [facility]  [address], [place] [address] [phone]  [facility]  [address], [place] [address] [phone]  [facility]  [address] [place] [address] [phone]  [facility]  [address], [place] [address] [phone]  [facility] Inpatient [place]  [place] [phone]  [facility]  [address] [place] [address] [phone]  [facility]  [address], [place] [address] [phone]  [facility] Patient Placement  [facility], [place] [phone]  [facility]  [address], [place] [address] [phone]  [facility]  [address] [place] [address] [phone]  [facility]  [address] [place] [address] [phone]  [facility]  [address] [place] [address] [phone]     Situation ongoing,  CSW will follow up.    Maryjean Ka, MSW, LCSWA 12/28/2022 11:59 AM

## 2022-12-28 NOTE — ED Notes (Signed)
VOL at this time

## 2022-12-29 NOTE — ED Notes (Signed)
Pt ambulated to safe transport w/ steady gait. VSS and pt calm and cooperative upon departure. All belongings and paperwork given to safe transport

## 2022-12-29 NOTE — ED Notes (Signed)
Notified Diplomatic Services operational officer about need for transport and notified EDP about need for EMTALA
